# Patient Record
Sex: Female | Born: 1990 | Race: Black or African American | Hispanic: No | Marital: Single | Smoking: Current every day smoker
Health system: Southern US, Community
[De-identification: ages and names within clinical notes are randomized; demographics above are authoritative.]

---

## 2011-10-28 ENCOUNTER — Encounter: Payer: Self-pay | Admitting: *Deleted

## 2011-10-28 ENCOUNTER — Emergency Department (HOSPITAL_BASED_OUTPATIENT_CLINIC_OR_DEPARTMENT_OTHER)
Admission: EM | Admit: 2011-10-28 | Discharge: 2011-10-28 | Disposition: A | Discharge status: Home / Self Care | Payer: Medicaid Other | Attending: Emergency Medicine | Admitting: Emergency Medicine

## 2011-10-28 DIAGNOSIS — IMO0002 Reserved for concepts with insufficient information to code with codable children: Secondary | ICD-10-CM | POA: Insufficient documentation

## 2011-10-28 DIAGNOSIS — T148XXA Other injury of unspecified body region, initial encounter: Secondary | ICD-10-CM

## 2011-10-28 DIAGNOSIS — M25519 Pain in unspecified shoulder: Secondary | ICD-10-CM | POA: Insufficient documentation

## 2011-10-28 DIAGNOSIS — X58XXXA Exposure to other specified factors, initial encounter: Secondary | ICD-10-CM | POA: Insufficient documentation

## 2011-10-28 MED ORDER — IBUPROFEN 600 MG PO TABS: 600.0000 mg | ORAL_TABLET | Freq: Four times a day (QID) | ORAL | Status: AC | PRN

## 2011-10-28 MED ORDER — IBUPROFEN 400 MG PO TABS
600.0000 mg | ORAL_TABLET | Freq: Once | ORAL | Status: AC
  Administered 2011-10-28: 600 mg via ORAL
  Filled 2011-10-28: qty 1

## 2011-10-28 NOTE — ED Notes (Signed)
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

## 2011-10-28 NOTE — ED Provider Notes (Signed)
History     CSN: [id number] Arrival date & time: 10/28/2011 10:22 AM   First MD Initiated Contact with Patient 10/28/11 1029      Chief Complaint  Patient presents with  . Shoulder Pain    (Consider location/radiation/quality/duration/timing/severity/associated sxs/prior treatment) Patient is a 20 y.o. female presenting with shoulder pain. The history is provided by the patient.  Shoulder Pain Pertinent negatives include no chest pain, no abdominal pain, no headaches and no shortness of breath.  pt c/o right shoulder pain in past few days. Dull, contant. Non radiating. Worse w shoulder movements and palp area. No hx ddd. No neck pain. No arm numbness/weakness. No pain in wrist, forearm or elbow. No swelling to extremity. 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 No fever.   History reviewed. No pertinent past medical history.  History reviewed. No pertinent past surgical history.  History reviewed. No pertinent family history.  History  Substance Use Topics  . Smoking status: Never Smoker   . Smokeless tobacco: Never Used  . Alcohol Use: No    OB History    Grav Para Term Preterm Abortions TAB SAB Ect Mult Living                  Review of Systems  Constitutional: Negative for fever.  HENT: Negative for neck pain.   Eyes: Negative for redness.  Respiratory: Negative for shortness of breath.   Cardiovascular: Negative for chest pain.  Gastrointestinal: Negative for abdominal pain.  Genitourinary: Negative for flank pain.  Musculoskeletal: Negative for back pain and joint swelling.  Skin: Negative for rash.  Neurological: Negative for weakness, numbness and headaches.  Hematological: Does not bruise/bleed easily.    Allergies  Review of patient's allergies indicates no known allergies.  Home Medications  No current outpatient  prescriptions on file.  BP 116/53  Pulse 70  Temp(Src) 98.8 F (37.1 C) (Oral)  Resp 18  Ht 5' 7.5" (1.715 m)  Wt 131 lb (59.421 kg)  BMI 20.21 kg/m2  SpO2 100%  LMP 09/29/2011  Physical Exam  Nursing note and vitals reviewed. Constitutional: She appears well-developed and well-nourished. No distress.  Eyes: Conjunctivae are normal. No scleral icterus.  Neck: Neck supple. No tracheal deviation present.       cspine nt, good rom without pain  Cardiovascular: Normal rate, regular rhythm, normal heart sounds and intact distal pulses.  Exam reveals no friction rub.   No murmur heard. Pulmonary/Chest: Effort normal and breath sounds normal. No respiratory distress.  Abdominal: Normal appearance. She exhibits no distension.  Musculoskeletal: Normal range of motion. She exhibits no edema.       Good rom right shoulder, passively without pain. Tenderness right trap muscle. No sts or skin changes noted. Good rom at elbow and wrist without pain. Distal pulses palp. No edema/swelling.   Neurological: She is alert.  Skin: Skin is warm and dry. No rash noted.  Psychiatric: She has a normal mood and affect.    ED Course  Procedures (including critical care time)  Labs Reviewed - No data to display No results found.   No diagnosis found.    MDM  Nursing notes reviewed.  Discussed diff dx incl rotator cuff/muscle strain, tendonitis, referred pain/ddd, etc. Confirmed nkda w pt. Motrin po.         Suzi Roots, MD 10/28/11 [phone]

## 2014-02-23 ENCOUNTER — Emergency Department (HOSPITAL_BASED_OUTPATIENT_CLINIC_OR_DEPARTMENT_OTHER)
Admission: EM | Admit: 2014-02-23 | Discharge: 2014-02-23 | Disposition: A | Discharge status: Home / Self Care | Payer: BC Managed Care – PPO | Attending: Emergency Medicine | Admitting: Emergency Medicine

## 2014-02-23 ENCOUNTER — Encounter (HOSPITAL_BASED_OUTPATIENT_CLINIC_OR_DEPARTMENT_OTHER): Payer: Self-pay | Admitting: Emergency Medicine

## 2014-02-23 DIAGNOSIS — Z3202 Encounter for pregnancy test, result negative: Secondary | ICD-10-CM | POA: Insufficient documentation

## 2014-02-23 DIAGNOSIS — F17200 Nicotine dependence, unspecified, uncomplicated: Secondary | ICD-10-CM | POA: Insufficient documentation

## 2014-02-23 DIAGNOSIS — B349 Viral infection, unspecified: Secondary | ICD-10-CM

## 2014-02-23 DIAGNOSIS — R110 Nausea: Secondary | ICD-10-CM

## 2014-02-23 DIAGNOSIS — B9789 Other viral agents as the cause of diseases classified elsewhere: Secondary | ICD-10-CM | POA: Insufficient documentation

## 2014-02-23 LAB — URINALYSIS, ROUTINE W REFLEX MICROSCOPIC
Bilirubin Urine: NEGATIVE
GLUCOSE, UA: NEGATIVE mg/dL
HGB URINE DIPSTICK: NEGATIVE
Ketones, ur: NEGATIVE mg/dL
LEUKOCYTES UA: NEGATIVE
Nitrite: NEGATIVE
PH: 7 (ref 5.0–8.0)
PROTEIN: NEGATIVE mg/dL
Specific Gravity, Urine: 1.022 (ref 1.005–1.030)
Urobilinogen, UA: 1 mg/dL (ref 0.0–1.0)

## 2014-02-23 LAB — PREGNANCY, URINE: PREG TEST UR: NEGATIVE

## 2014-02-23 MED ORDER — ONDANSETRON 4 MG PO TBDP
4.0000 mg | ORAL_TABLET | Freq: Once | ORAL | Status: AC
  Administered 2014-02-23: 4 mg via ORAL
  Filled 2014-02-23: qty 1

## 2014-02-23 MED ORDER — ONDANSETRON 4 MG PO TBDP: 4.0000 mg | ORAL_TABLET | Freq: Three times a day (TID) | ORAL | Status: DC | PRN

## 2014-02-23 NOTE — Discharge Instructions (Signed)
Nausea, Adult  Nausea means you feel sick to your stomach or need to throw up (vomit). It may be a sign of a more serious problem. If nausea gets worse, you may throw up. If you throw up a lot, you may lose too much body fluid (dehydration).  HOME CARE   · Get plenty of rest.  · Ask your doctor how to replace body fluid losses (rehydrate).  · Eat small amounts of food. Sip liquids more often.  · Take all medicines as told by your doctor.  GET HELP RIGHT AWAY IF:  · You have a fever.  · You pass out (faint).  · You keep throwing up or have blood in your throw up.  · You are very weak, have dry lips or a dry mouth, or you are very thirsty (dehydrated).  · You have dark or bloody poop (stool).  · You have very bad chest or belly (abdominal) pain.  · You do not get better after 2 days, or you get worse.  · You have a headache.  MAKE SURE YOU:  · Understand these instructions.  · Will watch your condition.  · Will get help right away if you are not doing well or get worse.  Document Released: 11/14/2011 Document Revised: 02/17/2012 Document Reviewed: 11/14/2011  ExitCare® Patient Information ©2014 [employer].

## 2014-02-23 NOTE — ED Provider Notes (Addendum)
CSN: [id number]     Arrival date & time 02/23/14  1622 History   First MD Initiated Contact with Patient 02/23/14 1626     Chief Complaint  Patient presents with  . Generalized Body Aches      HPI  Pt reports generalized body aches x 1 week.  Mild associated nausea. Occasional abdominal pain. Mild intermittent headache. No sore throat. No dyspnea shortness of breath. No dysuria or frank hematuria. Denies pregnancy.   History reviewed. No pertinent past medical history. History reviewed. No pertinent past surgical history. History reviewed. No pertinent family history. History  Substance Use Topics  . Smoking status: Current Every Day Smoker -- 0.50 packs/day    Types: Cigarettes  . Smokeless tobacco: Never Used  . Alcohol Use: No   OB History   Grav Para Term Preterm Abortions TAB SAB Ect Mult Living                 Review of Systems  Constitutional: Positive for fatigue. Negative for fever, chills, diaphoresis and appetite change.  HENT: Negative for mouth sores, sore throat and trouble swallowing.   Eyes: Negative for visual disturbance.  Respiratory: Negative for cough, chest tightness, shortness of breath and wheezing.   Cardiovascular: Negative for chest pain.  Gastrointestinal: Positive for nausea and abdominal pain. Negative for vomiting, diarrhea and abdominal distention.  Endocrine: Negative for polydipsia, polyphagia and polyuria.  Genitourinary: Negative for dysuria, frequency and hematuria.  Musculoskeletal: Positive for myalgias. Negative for gait problem.  Skin: Negative for color change, pallor and rash.  Neurological: Positive for headaches. Negative for dizziness, syncope and light-headedness.  Hematological: Does not bruise/bleed easily.  Psychiatric/Behavioral: Negative for behavioral problems and confusion.      Allergies  Review of patient's allergies indicates no known allergies.  Home Medications   Current Outpatient Rx  Name  Route  Sig   Dispense  Refill  . ondansetron (ZOFRAN ODT) 4 MG disintegrating tablet   Oral   Take 1 tablet (4 mg total) by mouth every 8 (eight) hours as needed for nausea.   20 tablet   0    BP 121/73  Pulse 70  Temp(Src) 97.8 F (36.6 C) (Oral)  Resp 18  SpO2 100%  LMP 02/09/2014 Physical Exam  Constitutional: She is oriented to person, place, and time. She appears well-developed and well-nourished. No distress.  HENT:  Head: Normocephalic.  Eyes: Conjunctivae are normal. Pupils are equal, round, and reactive to light. No scleral icterus.  Neck: Normal range of motion. Neck supple. No thyromegaly present.  Cardiovascular: Normal rate and regular rhythm.  Exam reveals no gallop and no friction rub.   No murmur heard. Pulmonary/Chest: Effort normal and breath sounds normal. No respiratory distress. She has no wheezes. She has no rales.  Abdominal: Soft. Bowel sounds are normal. She exhibits no distension. There is no tenderness. There is no rebound.  Musculoskeletal: Normal range of motion.  Neurological: She is alert and oriented to person, place, and time.  Skin: Skin is warm and dry. No rash noted.  Psychiatric: She has a normal mood and affect. Her behavior is normal.    ED Course  Procedures (including critical care time) Labs Review Labs Reviewed  URINALYSIS, ROUTINE W REFLEX MICROSCOPIC - Abnormal; Notable for the following:    APPearance CLOUDY (*)    All other components within normal limits  PREGNANCY, URINE   Imaging Review No results found.   EKG Interpretation None      MDM  Final diagnoses:  Nausea  Viral syndrome    Patient isimproved. Normal urine. Diffuse symptoms. Nausea. Bodyaches for the first day. None since. Occasional cough. No headache or sore throat today. Afebrile. Not hypoxemic. Likely  viral syndrome. Plan will be expectant management. Symptomatic treatment with  Zofran for nausea. Rest and hydrate.    Rolland Porter, MD 02/23/14 1721  Rolland Porter, MD 03/21/14 2350

## 2014-02-23 NOTE — ED Notes (Signed)
Pt reports generalized body aches x 1 week.  Reports decrease in appetite and abdominal pain.

## 2014-06-13 ENCOUNTER — Emergency Department (HOSPITAL_BASED_OUTPATIENT_CLINIC_OR_DEPARTMENT_OTHER): Payer: BC Managed Care – PPO

## 2014-06-13 ENCOUNTER — Emergency Department (HOSPITAL_BASED_OUTPATIENT_CLINIC_OR_DEPARTMENT_OTHER)
Admission: EM | Admit: 2014-06-13 | Discharge: 2014-06-13 | Disposition: A | Discharge status: Home / Self Care | Payer: BC Managed Care – PPO | Attending: Emergency Medicine | Admitting: Emergency Medicine

## 2014-06-13 ENCOUNTER — Encounter (HOSPITAL_BASED_OUTPATIENT_CLINIC_OR_DEPARTMENT_OTHER): Payer: Self-pay | Admitting: Emergency Medicine

## 2014-06-13 DIAGNOSIS — M79609 Pain in unspecified limb: Secondary | ICD-10-CM | POA: Insufficient documentation

## 2014-06-13 DIAGNOSIS — M79671 Pain in right foot: Secondary | ICD-10-CM

## 2014-06-13 DIAGNOSIS — F17200 Nicotine dependence, unspecified, uncomplicated: Secondary | ICD-10-CM | POA: Insufficient documentation

## 2014-06-13 MED ORDER — TRAMADOL HCL 50 MG PO TABS: 50.0000 mg | ORAL_TABLET | Freq: Four times a day (QID) | ORAL | Status: DC | PRN

## 2014-06-13 NOTE — ED Notes (Signed)
Pt had sudden onset of pain in the top of her right foot yesterday. Swollen this am. She can only recall a possible injury when she stepped down abruptly on a curb.

## 2014-06-13 NOTE — Discharge Instructions (Signed)
Ibuprofen 600 mg every 6 hours as needed for pain. Tramadol as needed for pain not relieved with ibuprofen.  Wear Ace bandage as applied. Elevate your foot as often as possible for the next 48 hours.  Return to ER if your symptoms worsen or change and follow up with your primary Dr. if not improving in the next week.   Musculoskeletal Pain Musculoskeletal pain is muscle and boney aches and pains. These pains can occur in any part of the body. Your caregiver may treat you without knowing the cause of the pain. They may treat you if blood or urine tests, X-rays, and other tests were normal.  CAUSES There is often not a definite cause or reason for these pains. These pains may be caused by a type of germ (virus). The discomfort may also come from overuse. Overuse includes working out too hard when your body is not fit. Boney aches also come from weather changes. Bone is sensitive to atmospheric pressure changes. HOME CARE INSTRUCTIONS   Ask when your test results will be ready. Make sure you get your test results.  Only take over-the-counter or prescription medicines for pain, discomfort, or fever as directed by your caregiver. If you were given medications for your condition, do not drive, operate machinery or power tools, or sign legal documents for 24 hours. Do not drink alcohol. Do not take sleeping pills or other medications that may interfere with treatment.  Continue all activities unless the activities cause more pain. When the pain lessens, slowly resume normal activities. Gradually increase the intensity and duration of the activities or exercise.  During periods of severe pain, bed rest may be helpful. Lay or sit in any position that is comfortable.  Putting ice on the injured area.  Put ice in a bag.  Place a towel between your skin and the bag.  Leave the ice on for 15 to 20 minutes, 3 to 4 times a day.  Follow up with your caregiver for continued problems and no reason can be  found for the pain. If the pain becomes worse or does not go away, it may be necessary to repeat tests or do additional testing. Your caregiver may need to look further for a possible cause. SEEK IMMEDIATE MEDICAL CARE IF:  You have pain that is getting worse and is not relieved by medications.  You develop chest pain that is associated with shortness or breath, sweating, feeling sick to your stomach (nauseous), or throw up (vomit).  Your pain becomes localized to the abdomen.  You develop any new symptoms that seem different or that concern you. MAKE SURE YOU:   Understand these instructions.  Will watch your condition.  Will get help right away if you are not doing well or get worse. Document Released: 11/25/2005 Document Revised: 02/17/2012 Document Reviewed: 07/30/2013 [facility] Patient Information 2015 [place], [place]. This information is not intended to replace advice given to you by your health care provider. Make sure you discuss any questions you have with your health care provider.

## 2014-06-13 NOTE — ED Notes (Signed)
Pt. Reports she stepped of the side of a curb yesterday not expecting to step to quick and so hard per Pt.  Pt. Has noted edema in the R foot up to the R mid leg.   Pt. Reports inability to stand on the R foot due to pain.  Pt. Has no noted deformity.

## 2014-06-13 NOTE — ED Provider Notes (Signed)
CSN: [id number]     Arrival date & time 06/13/14  1247 History   First MD Initiated Contact with Patient 06/13/14 1426     Chief Complaint  Patient presents with  . Foot Pain     (Consider location/radiation/quality/duration/timing/severity/associated sxs/prior Treatment) HPI Comments: Patient is a 23 year old female who presents with complaints of pain to the top of her right foot. She states she stepped awkwardly from a curb yesterday but denies any significant injury. She has had worsening pain to the top of her foot since that time. She states she is having difficulty ambulating due to her pain.  Patient is a 23 y.o. female presenting with lower extremity pain. The history is provided by the patient.  Foot Pain This is a new problem. The current episode started yesterday. The problem occurs constantly. The problem has been rapidly worsening. Pertinent negatives include no chest pain and no shortness of breath. Nothing aggravates the symptoms. Nothing relieves the symptoms.    History reviewed. No pertinent past medical history. History reviewed. No pertinent past surgical history. No family history on file. History  Substance Use Topics  . Smoking status: Current Every Day Smoker -- 0.50 packs/day    Types: Cigarettes  . Smokeless tobacco: Never Used  . Alcohol Use: No   OB History   Grav Para Term Preterm Abortions TAB SAB Ect Mult Living                 Review of Systems  Respiratory: Negative for shortness of breath.   Cardiovascular: Negative for chest pain.  All other systems reviewed and are negative.     Allergies  Review of patient's allergies indicates not on file.  Home Medications   Prior to Admission medications   Medication Sig Start Date End Date Taking? Authorizing Provider  ondansetron (ZOFRAN ODT) 4 MG disintegrating tablet Take 1 tablet (4 mg total) by mouth every 8 (eight) hours as needed for nausea. 02/23/14   Rolland PorterMark James, MD   BP 116/72  Pulse  72  Temp(Src) 98 F (36.7 C) (Oral)  Resp 16  Ht 5\' 8"  (1.727 m)  Wt 137 lb (62.143 kg)  BMI 20.84 kg/m2  SpO2 100%  LMP 06/02/2014 Physical Exam  Nursing note and vitals reviewed. Constitutional: She is oriented to person, place, and time. She appears well-developed and well-nourished. No distress.  HENT:  Head: Normocephalic and atraumatic.  Neck: Normal range of motion. Neck supple.  Musculoskeletal:  The right foot appears grossly normal. I am unable to appreciate any significant swelling or edema. There is no erythema or warmth. There is exquisite tenderness to palpation over the dorsal aspect of the foot. She is able to flex and extend her toes and capillary refill is brisk.  There is no calf tenderness or ankle edema.  Neurological: She is alert and oriented to person, place, and time.  Skin: Skin is warm and dry. She is not diaphoretic.    ED Course  Procedures (including critical care time) Labs Review Labs Reviewed - No data to display  Imaging Review Dg Foot Complete Right  06/13/2014   CLINICAL DATA:  Pain and swelling all over RIGHT foot since yesterday, no known injury  EXAM: RIGHT FOOT COMPLETE - 3+ VIEW  COMPARISON:  None  FINDINGS: Osseous mineralization normal.  Joint spaces preserved.  No acute fracture, dislocation or bone destruction.  IMPRESSION: No acute osseous abnormalities.   Electronically Signed   By: Ulyses SouthwardMark  Boles M.D.   On: 06/13/2014  14:09     EKG Interpretation None      MDM   Final diagnoses:  None    X-rays are negative for fracture. This appears to be a sprain of the foot. This will be treated with ibuprofen, tramadol, compression, and when necessary followup. Her history and exam are not consistent with DVT and I do not feel as though further evaluation into this is indicated.    Geoffery Lyonsouglas Lethia Donlon, MD 06/13/14 1435

## 2014-06-26 ENCOUNTER — Encounter (HOSPITAL_BASED_OUTPATIENT_CLINIC_OR_DEPARTMENT_OTHER): Payer: Self-pay | Admitting: Emergency Medicine

## 2014-06-26 ENCOUNTER — Emergency Department (HOSPITAL_BASED_OUTPATIENT_CLINIC_OR_DEPARTMENT_OTHER)
Admission: EM | Admit: 2014-06-26 | Discharge: 2014-06-26 | Disposition: A | Discharge status: Home / Self Care | Payer: BC Managed Care – PPO | Attending: Emergency Medicine | Admitting: Emergency Medicine

## 2014-06-26 DIAGNOSIS — Z3202 Encounter for pregnancy test, result negative: Secondary | ICD-10-CM | POA: Insufficient documentation

## 2014-06-26 DIAGNOSIS — R300 Dysuria: Secondary | ICD-10-CM | POA: Insufficient documentation

## 2014-06-26 DIAGNOSIS — F17200 Nicotine dependence, unspecified, uncomplicated: Secondary | ICD-10-CM | POA: Insufficient documentation

## 2014-06-26 DIAGNOSIS — Z202 Contact with and (suspected) exposure to infections with a predominantly sexual mode of transmission: Secondary | ICD-10-CM | POA: Insufficient documentation

## 2014-06-26 DIAGNOSIS — A499 Bacterial infection, unspecified: Secondary | ICD-10-CM | POA: Insufficient documentation

## 2014-06-26 DIAGNOSIS — N760 Acute vaginitis: Secondary | ICD-10-CM | POA: Insufficient documentation

## 2014-06-26 DIAGNOSIS — R3915 Urgency of urination: Secondary | ICD-10-CM | POA: Insufficient documentation

## 2014-06-26 DIAGNOSIS — B9689 Other specified bacterial agents as the cause of diseases classified elsewhere: Secondary | ICD-10-CM | POA: Insufficient documentation

## 2014-06-26 LAB — URINE MICROSCOPIC-ADD ON

## 2014-06-26 LAB — URINALYSIS, ROUTINE W REFLEX MICROSCOPIC
BILIRUBIN URINE: NEGATIVE
GLUCOSE, UA: NEGATIVE mg/dL
Glucose, UA: NEGATIVE mg/dL
HGB URINE DIPSTICK: NEGATIVE
KETONES UR: 15 mg/dL — AB
Ketones, ur: NEGATIVE mg/dL
NITRITE: NEGATIVE
Nitrite: NEGATIVE
PH: 6 (ref 5.0–8.0)
PH: 6 (ref 5.0–8.0)
PROTEIN: NEGATIVE mg/dL
Protein, ur: NEGATIVE mg/dL
SPECIFIC GRAVITY, URINE: 1.025 (ref 1.005–1.030)
Specific Gravity, Urine: 1.024 (ref 1.005–1.030)
UROBILINOGEN UA: 1 mg/dL (ref 0.0–1.0)
Urobilinogen, UA: 1 mg/dL (ref 0.0–1.0)

## 2014-06-26 LAB — WET PREP, GENITAL
Trich, Wet Prep: NONE SEEN
Yeast Wet Prep HPF POC: NONE SEEN

## 2014-06-26 LAB — RPR

## 2014-06-26 LAB — PREGNANCY, URINE: Preg Test, Ur: NEGATIVE

## 2014-06-26 LAB — HIV ANTIBODY (ROUTINE TESTING W REFLEX): HIV: NONREACTIVE

## 2014-06-26 MED ORDER — METRONIDAZOLE 500 MG PO TABS: 2000.0000 mg | ORAL_TABLET | Freq: Once | ORAL | Status: DC

## 2014-06-26 MED ORDER — METRONIDAZOLE 500 MG PO TABS
2000.0000 mg | ORAL_TABLET | Freq: Once | ORAL | Status: AC
  Administered 2014-06-26: 2000 mg via ORAL
  Filled 2014-06-26: qty 4

## 2014-06-26 NOTE — ED Notes (Signed)
Pelvic cart is at the bedside set up and ready for the doctor to use. 

## 2014-06-26 NOTE — ED Provider Notes (Signed)
CSN: [id number]     Arrival date & time 06/26/14  1231 History   First MD Initiated Contact with Patient 06/26/14 1350     Chief Complaint  Patient presents with  . Exposure to STD     (Consider location/radiation/quality/duration/timing/severity/associated sxs/prior Treatment) HPI Sandra Johns is a 23 y.o. female that presents to the ED for exposure to an STI. Patient's boyfriend was recently treated for Trichomonas. Patient has some vaginal discharge that is clear with a "strong" odor. No fever, nausea or vomiting. Currently on menstrual cycle. Has dysuria and urgency today.  History reviewed. No pertinent past medical history. History reviewed. No pertinent past surgical history. No family history on file. History  Substance Use Topics  . Smoking status: Current Every Day Smoker -- 0.50 packs/day    Types: Cigarettes  . Smokeless tobacco: Never Used  . Alcohol Use: No  Smoking: 1/4PPD 4-5 years.  OB History   Grav Para Term Preterm Abortions TAB SAB Ect Mult Living                  Review of Systems  Constitutional: Negative for fever, chills and diaphoresis.  Genitourinary: Positive for dysuria, urgency and vaginal discharge. Negative for flank pain.  All other systems reviewed and are negative.     Allergies  Review of patient's allergies indicates no known allergies.  Home Medications   Prior to Admission medications   Medication Sig Start Date End Date Taking? Authorizing Provider  ondansetron (ZOFRAN ODT) 4 MG disintegrating tablet Take 1 tablet (4 mg total) by mouth every 8 (eight) hours as needed for nausea. 02/23/14   Rolland Porter, MD  traMADol (ULTRAM) 50 MG tablet Take 1 tablet (50 mg total) by mouth every 6 (six) hours as needed. 06/13/14   Geoffery Lyons, MD   BP 138/90  Pulse 72  Temp(Src) 98.8 F (37.1 C) (Oral)  Resp 16  Ht 5\' 8"  (1.727 m)  Wt 135 lb (61.236 kg)  BMI 20.53 kg/m2  SpO2 100%  LMP 06/02/2014 Physical Exam  Constitutional: She is  oriented to person, place, and time. She appears well-developed.  Abdominal: Soft. Normal appearance and bowel sounds are normal. She exhibits no distension. There is no tenderness. There is no rigidity, no rebound and no guarding.  Genitourinary: Cervix exhibits no motion tenderness. Right adnexum displays no mass. Left adnexum displays no mass.  Bleeding from os. Pool of blood in posterior fornix.  Neurological: She is alert and oriented to person, place, and time.  Skin: Skin is warm and dry.    ED Course  Procedures (including critical care time) Labs Review Labs Reviewed  WET PREP, GENITAL - Abnormal; Notable for the following:    Clue Cells Wet Prep HPF POC FEW (*)    WBC, Wet Prep HPF POC FEW (*)    All other components within normal limits  URINALYSIS, ROUTINE W REFLEX MICROSCOPIC - Abnormal; Notable for the following:    Color, Urine AMBER (*)    Hgb urine dipstick LARGE (*)    Bilirubin Urine SMALL (*)    Leukocytes, UA SMALL (*)    All other components within normal limits  URINE MICROSCOPIC-ADD ON - Abnormal; Notable for the following:    Squamous Epithelial / LPF MANY (*)    Bacteria, UA MANY (*)    All other components within normal limits  URINALYSIS, ROUTINE W REFLEX MICROSCOPIC - Abnormal; Notable for the following:    Ketones, ur 15 (*)    Leukocytes,  UA TRACE (*)    All other components within normal limits  URINE MICROSCOPIC-ADD ON - Abnormal; Notable for the following:    Bacteria, UA MANY (*)    All other components within normal limits  GC/CHLAMYDIA PROBE AMP  URINE CULTURE  PREGNANCY, URINE  HIV ANTIBODY (ROUTINE TESTING)  RPR    Imaging Review No results found.   EKG Interpretation None      MDM   Final diagnoses:  Exposure to sexually transmitted disease (STD)  Bacterial vaginosis   Patient with initial urinalysis contaminated with blood. Repeat UA on in/out catheter specimen. Patient tolerated. Urinalysis significant for bacteria and  trace leukocytes. Wet prep significant for clue cells. Patient given Flagyl 2g for treatment of Trichomonas due to exposure. Will also cover bacterial vaginosis. Will follow-up culture for UTI. Gonorrhea, chlamydia, HIV and RPR in process and will follow-up result. Patient informed of plan. Patient understood and agreed with plan. Stable for discharge home.    Jacquelin Hawkingalph Logan Baltimore, MD 06/26/14 [phone]

## 2014-06-26 NOTE — ED Notes (Signed)
Patient states that she was notified by a previous partner that she may need to be treated for trichomonas.

## 2014-06-26 NOTE — Discharge Instructions (Signed)
You were treated for possible exposure to trichomonas. You were also found to have clue cells which could indicate Bacterial vaginosis. We treated you with metronidazole. Your urinalysis is not suggestive of a urinary infection but we are getting a culture to see if bacteria will grow. We also tested you for gonorrhea, chlamydia, HIV and Syphilis. If any of this is positive, you will be given a call for treatment or follow-up. Please do not have sex for the next 7 days to avoid possible reinfection.

## 2014-06-27 LAB — GC/CHLAMYDIA PROBE AMP
CT PROBE, AMP APTIMA: POSITIVE — AB
GC Probe RNA: NEGATIVE

## 2014-06-27 NOTE — ED Provider Notes (Signed)
I saw and evaluated the patient, reviewed the resident's note and I agree with the findings and plan.   .Face to face Exam:  General:  Awake HEENT:  Atraumatic Resp:  Normal effort Abd:  Nondistended Neuro:No focal weakness   Nelia Shiobert L Madora Barletta, MD 06/27/14 1350

## 2014-06-28 ENCOUNTER — Telehealth (HOSPITAL_BASED_OUTPATIENT_CLINIC_OR_DEPARTMENT_OTHER): Payer: Self-pay | Admitting: Emergency Medicine

## 2014-06-28 NOTE — Telephone Encounter (Signed)
+  Chlamydia. Chart sent to EDP office for review. DHHS attached. 

## 2014-06-29 LAB — URINE CULTURE

## 2015-07-03 ENCOUNTER — Encounter (HOSPITAL_BASED_OUTPATIENT_CLINIC_OR_DEPARTMENT_OTHER): Payer: Self-pay

## 2015-07-03 ENCOUNTER — Emergency Department (HOSPITAL_BASED_OUTPATIENT_CLINIC_OR_DEPARTMENT_OTHER)
Admission: EM | Admit: 2015-07-03 | Discharge: 2015-07-03 | Disposition: A | Discharge status: Home / Self Care | Payer: BC Managed Care – PPO | Attending: Emergency Medicine | Admitting: Emergency Medicine

## 2015-07-03 DIAGNOSIS — Z720 Tobacco use: Secondary | ICD-10-CM | POA: Insufficient documentation

## 2015-07-03 DIAGNOSIS — Z3202 Encounter for pregnancy test, result negative: Secondary | ICD-10-CM | POA: Diagnosis not present

## 2015-07-03 DIAGNOSIS — N3000 Acute cystitis without hematuria: Secondary | ICD-10-CM | POA: Diagnosis not present

## 2015-07-03 DIAGNOSIS — R1030 Lower abdominal pain, unspecified: Secondary | ICD-10-CM | POA: Diagnosis present

## 2015-07-03 LAB — COMPREHENSIVE METABOLIC PANEL
ALBUMIN: 4 g/dL (ref 3.5–5.0)
ALT: 30 U/L (ref 14–54)
AST: 27 U/L (ref 15–41)
Alkaline Phosphatase: 48 U/L (ref 38–126)
Anion gap: 5 (ref 5–15)
BUN: 11 mg/dL (ref 6–20)
CALCIUM: 9.2 mg/dL (ref 8.9–10.3)
CHLORIDE: 108 mmol/L (ref 101–111)
CO2: 26 mmol/L (ref 22–32)
CREATININE: 0.87 mg/dL (ref 0.44–1.00)
GFR calc non Af Amer: >60 mL/min (ref >60)
Glucose, Bld: 87 mg/dL (ref 65–99)
Potassium: 3.6 mmol/L (ref 3.5–5.1)
Sodium: 139 mmol/L (ref 135–145)
Total Bilirubin: 0.5 mg/dL (ref 0.3–1.2)
Total Protein: 7 g/dL (ref 6.5–8.1)

## 2015-07-03 LAB — CBC WITH DIFFERENTIAL/PLATELET
BASOS ABS: 0 10*3/uL (ref 0.0–0.1)
Basophils Relative: 1 % (ref 0–1)
EOS PCT: 1 % (ref 0–5)
Eosinophils Absolute: 0 10*3/uL (ref 0.0–0.7)
HEMATOCRIT: 37.5 % (ref 36.0–46.0)
Hemoglobin: 13.3 g/dL (ref 12.0–15.0)
LYMPHS PCT: 43 % (ref 12–46)
Lymphs Abs: 2.2 10*3/uL (ref 0.7–4.0)
MCH: 31.3 pg (ref 26.0–34.0)
MCHC: 35.5 g/dL (ref 30.0–36.0)
MCV: 88.2 fL (ref 78.0–100.0)
MONO ABS: 0.6 10*3/uL (ref 0.1–1.0)
MONOS PCT: 11 % (ref 3–12)
NEUTROS ABS: 2.3 10*3/uL (ref 1.7–7.7)
Neutrophils Relative %: 44 % (ref 43–77)
Platelets: 222 10*3/uL (ref 150–400)
RBC: 4.25 MIL/uL (ref 3.87–5.11)
RDW: 12.2 % (ref 11.5–15.5)
WBC: 5.1 10*3/uL (ref 4.0–10.5)

## 2015-07-03 LAB — URINALYSIS, ROUTINE W REFLEX MICROSCOPIC
BILIRUBIN URINE: NEGATIVE
Glucose, UA: NEGATIVE mg/dL
Ketones, ur: NEGATIVE mg/dL
NITRITE: NEGATIVE
PROTEIN: NEGATIVE mg/dL
Specific Gravity, Urine: 1.023 (ref 1.005–1.030)
Urobilinogen, UA: 0.2 mg/dL (ref 0.0–1.0)
pH: 6.5 (ref 5.0–8.0)

## 2015-07-03 LAB — URINE MICROSCOPIC-ADD ON

## 2015-07-03 LAB — PREGNANCY, URINE: Preg Test, Ur: NEGATIVE

## 2015-07-03 MED ORDER — NITROFURANTOIN MONOHYD MACRO 100 MG PO CAPS
100.0000 mg | ORAL_CAPSULE | Freq: Once | ORAL | Status: AC
  Administered 2015-07-03: 100 mg via ORAL
  Filled 2015-07-03: qty 1

## 2015-07-03 MED ORDER — PHENAZOPYRIDINE HCL 200 MG PO TABS: 200.0000 mg | ORAL_TABLET | Freq: Three times a day (TID) | ORAL | Status: DC

## 2015-07-03 MED ORDER — PHENAZOPYRIDINE HCL 100 MG PO TABS
200.0000 mg | ORAL_TABLET | Freq: Once | ORAL | Status: AC
  Administered 2015-07-03: 200 mg via ORAL
  Filled 2015-07-03: qty 2

## 2015-07-03 MED ORDER — NITROFURANTOIN MONOHYD MACRO 100 MG PO CAPS: 100.0000 mg | ORAL_CAPSULE | Freq: Two times a day (BID) | ORAL | Status: DC

## 2015-07-03 NOTE — ED Provider Notes (Signed)
CSN: [id number]     Arrival date & time 07/03/15  1127 History   First MD Initiated Contact with Patient 07/03/15 1129     Chief Complaint  Patient presents with  . Flank Pain     (Consider location/radiation/quality/duration/timing/severity/associated sxs/prior Treatment) HPI Comments: Patient with history of chlamydia -- presents with complaint of right flank pain, dysuria, increased frequency and urgency, suprapubic pain for the past 5 days. Symptoms are gradually worsening. Pain does not radiate. She has not had associated fever or vomiting. She has had some nausea. No diarrhea or changes in stools. Patient denies vaginal bleeding or discharge. Last menstrual period was 2 weeks ago and was normal for patient. She is sexually active. No treatments PTA. The onset of this condition was acute. Aggravating factors: palpation. Alleviating factors: none.    Patient is a 24 y.o. female presenting with flank pain. The history is provided by the patient.  Flank Pain Associated symptoms include abdominal pain and nausea. Pertinent negatives include no chest pain, coughing, fever, headaches, myalgias, rash, sore throat or vomiting.    History reviewed. No pertinent past medical history. History reviewed. No pertinent past surgical history. No family history on file. History  Substance Use Topics  . Smoking status: Current Every Day Smoker -- 0.50 packs/day    Types: Cigarettes  . Smokeless tobacco: Never Used  . Alcohol Use: No   OB History    No data available     Review of Systems  Constitutional: Negative for fever.  HENT: Negative for rhinorrhea and sore throat.   Eyes: Negative for redness.  Respiratory: Negative for cough.   Cardiovascular: Negative for chest pain.  Gastrointestinal: Positive for nausea and abdominal pain. Negative for vomiting and diarrhea.  Genitourinary: Positive for dysuria, frequency and flank pain. Negative for hematuria, vaginal bleeding, vaginal discharge  and pelvic pain.  Musculoskeletal: Negative for myalgias.  Skin: Negative for rash.  Neurological: Negative for headaches.    Allergies  Review of patient's allergies indicates no known allergies.  Home Medications   Prior to Admission medications   Not on File   BP 131/81 mmHg  Pulse 58  Temp(Src) 98.6 F (37 C) (Oral)  Resp 16  Ht 5\' 7"  (1.702 m)  Wt 135 lb (61.236 kg)  BMI 21.14 kg/m2  SpO2 100%  LMP 06/19/2015   Physical Exam  Constitutional: She appears well-developed and well-nourished.  HENT:  Head: Normocephalic and atraumatic.  Eyes: Conjunctivae are normal. Right eye exhibits no discharge. Left eye exhibits no discharge.  Neck: Normal range of motion. Neck supple.  Cardiovascular: Normal rate, regular rhythm and normal heart sounds.   No murmur heard. Pulmonary/Chest: Effort normal and breath sounds normal. No respiratory distress.  Abdominal: Soft. Bowel sounds are normal. She exhibits no distension. There is tenderness (no RLQ tenderness) in the suprapubic area. There is no rigidity, no rebound, no guarding, no CVA tenderness (none exhibited), no tenderness at McBurney's point and negative Murphy's sign.  Neurological: She is alert.  Skin: Skin is warm and dry.  Psychiatric: She has a normal mood and affect.  Nursing note and vitals reviewed.   ED Course  Procedures (including critical care time) Labs Review Labs Reviewed  URINALYSIS, ROUTINE W REFLEX MICROSCOPIC (NOT AT [facility]) - Abnormal; Notable for the following:    APPearance CLOUDY (*)    Hgb urine dipstick TRACE (*)    Leukocytes, UA LARGE (*)    All other components within normal limits  URINE MICROSCOPIC-ADD ON -  Abnormal; Notable for the following:    Bacteria, UA MANY (*)    All other components within normal limits  PREGNANCY, URINE  CBC WITH DIFFERENTIAL/PLATELET  COMPREHENSIVE METABOLIC PANEL    Imaging Review No results found.   EKG Interpretation None       11:57 AM Patient  seen and examined. Work-up initiated. Medications ordered.   Vital signs reviewed and are as follows: BP 131/81 mmHg  Pulse 58  Temp(Src) 98.6 F (37 C) (Oral)  Resp 16  Ht  (1.702 m)  Wt 135 lb (61.236 kg)  BMI 21.14 kg/m2  SpO2 100%  LMP 06/19/2015  1:27 PM Patient has obviously infected urine. Will treat with macrobid/pyridium. Symptoms are suggestive of cystitis.   Patient urged to return with worsening symptoms including pain, fever, vomiting or other concerns. Patient verbalized understanding and agrees with plan.    MDM   Final diagnoses:  Acute cystitis without hematuria   Patients with irritative UTI symptoms, too numerous to count white blood cells in urine in a clean catch with any bacteria. Patient has flank pain however no vomiting or fever. I do not suspect pyelonephritis. She does not report any vaginal bleeding or discharge to suggest STD or PID. She appears well, nontoxic. Will treat for UTI. Discussed signs and symptoms that should cause her to return including signs of pyelonephritis.    Renne Crigler, PA-C 07/03/15 1329  Doug Sou, MD 07/03/15 1723

## 2015-07-03 NOTE — Discharge Instructions (Signed)
Please read and follow all provided instructions.  Your diagnoses today include:  1. Acute cystitis without hematuria     Tests performed today include:  Urine test - suggests that you have an infection in your bladder  Vital signs. See below for your results today.   Medications prescribed:   Macrobid - antibiotic for urinary tract infection  You have been prescribed an antibiotic medicine: take the entire course of medicine even if you are feeling better. Stopping early can cause the antibiotic not to work.   Pyridium - medication for urinary tract infection symptoms.   This medication will turn your urine orange. This is normal.   Home care instructions:  Follow any educational materials contained in this packet.  Follow-up instructions: Please follow-up with your primary care provider in 3 days if symptoms are not resolved for further evaluation of your symptoms.  Return instructions:   Please return to the Emergency Department if you experience worsening symptoms.   Return with fever, worsening pain, persistent vomiting, worsening pain in your back.   Please return if you have any other emergent concerns.  Additional Information:  Your vital signs today were: BP 131/81 mmHg   Pulse 58   Temp(Src) 98.6 F (37 C) (Oral)   Resp 16   Ht  (1.702 m)   Wt 135 lb (61.236 kg)   BMI 21.14 kg/m2   SpO2 100%   LMP 06/19/2015 If your blood pressure (BP) was elevated above 135/85 this visit, please have this repeated by your doctor within one month. --------------

## 2015-07-03 NOTE — ED Notes (Signed)
Pt and sig other given snacks and refreshments to go per request.

## 2015-07-03 NOTE — ED Notes (Signed)
Right flank and abd pain x 5 days with urinary freq

## 2015-10-17 ENCOUNTER — Encounter (HOSPITAL_BASED_OUTPATIENT_CLINIC_OR_DEPARTMENT_OTHER): Payer: Self-pay | Admitting: *Deleted

## 2015-10-17 ENCOUNTER — Emergency Department (HOSPITAL_BASED_OUTPATIENT_CLINIC_OR_DEPARTMENT_OTHER)
Admission: EM | Admit: 2015-10-17 | Discharge: 2015-10-17 | Disposition: A | Discharge status: Home / Self Care | Payer: BC Managed Care – PPO | Attending: Emergency Medicine | Admitting: Emergency Medicine

## 2015-10-17 DIAGNOSIS — B9789 Other viral agents as the cause of diseases classified elsewhere: Secondary | ICD-10-CM

## 2015-10-17 DIAGNOSIS — Z79899 Other long term (current) drug therapy: Secondary | ICD-10-CM | POA: Diagnosis not present

## 2015-10-17 DIAGNOSIS — Z720 Tobacco use: Secondary | ICD-10-CM | POA: Insufficient documentation

## 2015-10-17 DIAGNOSIS — J069 Acute upper respiratory infection, unspecified: Secondary | ICD-10-CM | POA: Diagnosis not present

## 2015-10-17 DIAGNOSIS — J029 Acute pharyngitis, unspecified: Secondary | ICD-10-CM | POA: Diagnosis present

## 2015-10-17 LAB — RAPID STREP SCREEN (MED CTR MEBANE ONLY): Streptococcus, Group A Screen (Direct): NEGATIVE

## 2015-10-17 MED ORDER — IBUPROFEN 800 MG PO TABS: 800.0000 mg | ORAL_TABLET | Freq: Three times a day (TID) | ORAL | Status: DC

## 2015-10-17 MED ORDER — IBUPROFEN 800 MG PO TABS
800.0000 mg | ORAL_TABLET | Freq: Once | ORAL | Status: AC
  Administered 2015-10-17: 800 mg via ORAL
  Filled 2015-10-17: qty 1

## 2015-10-17 MED ORDER — ONDANSETRON 8 MG PO TBDP
8.0000 mg | ORAL_TABLET | Freq: Once | ORAL | Status: AC
  Administered 2015-10-17: 8 mg via ORAL
  Filled 2015-10-17: qty 1

## 2015-10-17 MED ORDER — BENZONATATE 100 MG PO CAPS
200.0000 mg | ORAL_CAPSULE | Freq: Once | ORAL | Status: AC
  Administered 2015-10-17: 200 mg via ORAL
  Filled 2015-10-17: qty 2

## 2015-10-17 MED ORDER — FLUTICASONE PROPIONATE 50 MCG/ACT NA SUSP: 2.0000 | Freq: Every day | NASAL | Status: DC

## 2015-10-17 MED ORDER — BENZONATATE 100 MG PO CAPS: 200.0000 mg | ORAL_CAPSULE | Freq: Two times a day (BID) | ORAL | Status: DC | PRN

## 2015-10-17 NOTE — Discharge Instructions (Signed)
1. Medications: flonase, mucinex, tessalon, usual home medications 2. Treatment: rest, drink plenty of fluids, take tylenol or ibuprofen for fever control 3. Follow Up: Please followup with your primary doctor in 3 days for discussion of your diagnoses and further evaluation after today's visit; if you do not have a primary care doctor use the resource guide provided to find one; Return to the ER for high fevers, difficulty breathing or other concerning symptoms    Cough, Adult Coughing is a reflex that clears your throat and your airways. Coughing helps to heal and protect your lungs. It is normal to cough occasionally, but a cough that happens with other symptoms or lasts a long time may be a sign of a condition that needs treatment. A cough may last only 2-3 weeks (acute), or it may last longer than 8 weeks (chronic). CAUSES Coughing is commonly caused by:  Breathing in substances that irritate your lungs.  A viral or bacterial respiratory infection.  Allergies.  Asthma.  Postnasal drip.  Smoking.  Acid backing up from the stomach into the esophagus (gastroesophageal reflux).  Certain medicines.  Chronic lung problems, including COPD (or rarely, lung cancer).  Other medical conditions such as heart failure. HOME CARE INSTRUCTIONS  Pay attention to any changes in your symptoms. Take these actions to help with your discomfort:  Take medicines only as told by your health care provider.  If you were prescribed an antibiotic medicine, take it as told by your health care provider. Do not stop taking the antibiotic even if you start to feel better.  Talk with your health care provider before you take a cough suppressant medicine.  Drink enough fluid to keep your urine clear or pale yellow.  If the air is dry, use a cold steam vaporizer or humidifier in your bedroom or your home to help loosen secretions.  Avoid anything that causes you to cough at work or at home.  If your  cough is worse at night, try sleeping in a semi-upright position.  Avoid cigarette smoke. If you smoke, quit smoking. If you need help quitting, ask your health care provider.  Avoid caffeine.  Avoid alcohol.  Rest as needed. SEEK MEDICAL CARE IF:   You have new symptoms.  You cough up pus.  Your cough does not get better after 2-3 weeks, or your cough gets worse.  You cannot control your cough with suppressant medicines and you are losing sleep.  You develop pain that is getting worse or pain that is not controlled with pain medicines.  You have a fever.  You have unexplained weight loss.  You have night sweats. SEEK IMMEDIATE MEDICAL CARE IF:  You cough up blood.  You have difficulty breathing.  Your heartbeat is very fast.   This information is not intended to replace advice given to you by your health care provider. Make sure you discuss any questions you have with your health care provider.   Document Released: 05/24/2011 Document Revised: 08/16/2015 Document Reviewed: 02/01/2015 Elsevier Interactive Patient Education 2016 [employer]    Emergency Department Resource Guide 1) Find a Doctor and Pay Out of Pocket Although you won't have to find out who is covered by your insurance plan, it is a good idea to ask around and get recommendations. You will then need to call the office and see if the doctor you have chosen will accept you as a new patient and what types of options they offer for patients who are self-pay. Some doctors  offer discounts or will set up payment plans for their patients who do not have insurance, but you will need to ask so you aren't surprised when you get to your appointment. ° °2) Contact Your Local Health Department °Not all health departments have doctors that can see patients for sick visits, but many do, so it is worth a call to see if yours does. If you don't know where your local health department is, you can check in your phone book.  The CDC also has a tool to help you locate your state's health department, and many state websites also have listings of all of their local health departments. ° °3) Find a Walk-in Clinic °If your illness is not likely to be very severe or complicated, you may want to try a walk in clinic. These are popping up all over the country in pharmacies, drugstores, and shopping centers. They're usually staffed by nurse practitioners or physician assistants that have been trained to treat common illnesses and complaints. They're usually fairly quick and inexpensive. However, if you have serious medical issues or chronic medical problems, these are probably not your best option. ° °No Primary Care Doctor: °- Call Health Connect at  832-8000 - they can help you locate a primary care doctor that  accepts your insurance, provides certain services, etc. °- Physician Referral Service- 1-800-533-3463 ° °Chronic Pain Problems: °Organization         Address  Phone   Notes  °[facility]  ([phone]) [phone] Patients need to be referred by their primary care doctor.  ° °Medication Assistance: °Organization         Address  Phone   Notes  °[place] [facility] [address] °[place], [place] [address] [phone]) 641-8030 --Must be a resident of [place] °-- Must have NO insurance coverage whatsoever (no Medicaid/ Medicare, etc.) °-- The pt. MUST have a primary care doctor that directs their care regularly and follows them in the community °  °[facility]ist  (866) 331-1348   °[employer]  (888) 892-1162   ° °Agencies that provide inexpensive medical care: °Organization         Address  Phone   Notes  °[facility]  ([phone]) [phone]   ° Internal Medicine    (336) 832-7272   °[facility] [address] °[place], [place] [address] (336) [phone]   °[facility] [address], °[place] (336) [phone]   °Planned Parenthood     (336) 373-0678   °[facility]    (336) 272-1050   °[facility] ° [address], [place] Phone:  (336) 832-4444, Fax:  (336) 832-4440 Hours of Operation:  9 am - 6 pm, M-F.  Also accepts Medicaid/Medicare and self-pay.  °[facility] ° [address], [place] Phone: (336) [phone], Fax: (336) [phone]. Hours of Operation:  8:30 am - 5:30 pm, M-F.  Also accepts Medicaid and self-pay.  °[facility] [place] [address], [place] Phone: ([phone]) [phone]   °[facility] [address], [place],  (336)723-1848, Ext. 123 Mondays & Thursdays: 7-9 AM.  First 15 patients are seen on a first come, first serve basis. °  ° °Medicaid-accepting [place] Providers: ° °Organization         Address  Phone   Notes  °[facility] [address], [place] ([phone]) [phone] Also accepts self-pay patients.  [facility] [address], [place]  [phone]) [phone]   [facility] [address], [place] [phone](336) [phone]   [facility] [address] [place] [address], [place] [phone]) [phone]   Renaye RakersVeita Bland [address], [place]   [phone] Only accepts [place] Access [place] patients after they have their name applied to their card.   Self-Pay (no insurance) in [facility]:  Organization         Address  Phone   Notes  Sickle Cell Patients, [facility] [address], [place] [phone]) [phone]   [facility] [address], [place] [phone]   [facility] [place]  [address],  [phone]   [facility]/Dr. Osei-Bonsu  [address] [place] [address], [place] or [phone], [place] [phone]) [phone] Phone number for both [place] and [place] locations is the same.  [facility] and  [facility] [address], [place] [phone](336) [phone]   [facility] [place] [address] [place] [address], [place] or [address] [phone]) [phone]   [facility] [address], [place] [phone](336) [phone], phone; [phone](336) [phone], fax Sees patients 1st and 3rd Saturday of every month.  Must not qualify for public or private insurance (i.e. Medicaid, Medicare, [place] Health Choice, Veterans' Benefits)  Household income should be no more than 200% of the poverty level The clinic cannot treat you if you are pregnant or think you are pregnant  Sexually transmitted diseases are not treated at the clinic.    Dental Care: Organization         Address  Phone  Notes  [facility] [place] [facility] [address] [place] [address], [place] [phone]) [phone] Accepts children up to age 25 who are enrolled in [place]icaid or [place] Health Choice; pregnant women with a Medicaid card; and children who have applied for Medicaid or [place] Health Choice, but were declined, whose parents can pay a reduced fee at time of service.  [facility] [place]  [address], [place] [phone](336) [phone] Accepts children up to age 25 who are enrolled in [place] or [place] Health Choice; pregnant women with a Medicaid card; and children who have applied for Medicaid or [place] Health Choice, but were declined, whose parents can pay a reduced fee at time of service.  [facility]  [address], [place] [phone]) [phone] Patients are seen by appointment only. Walk-ins are not accepted. [facility] will see patients 25 years of age and older. Monday - Tuesday (8am-5pm) Most Wednesdays (8:30-5pm) $30 per visit, cash only  [facility]  [address], [facility] [phone]) [phone] Patients are seen by appointment only. Walk-ins are not accepted. [facility] will see patients 25 years of age and  older. One Wednesday Evening (Monthly: Volunteer Based).  $30 per visit, cash only  [facility]  [phone] for adults; Children under age 34, call Graduate Pediatric Dentistry at [phone]. Children aged 54-14, please call [phone] to request a pediatric application.  Dental services are provided in all areas of dental care including fillings, crowns and bridges, complete and partial dentures, implants, gum treatment, root canals, and extractions. Preventive care  is also provided. Treatment is provided to both adults and children. Patients are selected via a lottery and there is often a waiting list.   [facility] [address], [place]  [phone]) [phone] [email or web address]   [facility] [address], [place], [place] [phone], Ext. [phone] Second and Fourth Thursday of each month, opens at 6:30 AM; Clinic ends at 9 AM.  Patients are seen on a first-come first-served basis, and a limited number are seen during each clinic.   [facility]  [address], [place], [place] [phone](336) [phone]   Eligibility Requirements You must have lived in [place], [place], or [place] counties for at least the last three months.   You cannot be eligible for state or federal sponsored [place] Cityhealthcare insurance, including CIGNAVeterans Administration, [place]icaid, or [employer] EntertainmentMedicare.   You generally cannot be eligible for healthcare insurance through your employer.    How to apply: Eligibility screenings are held every Tuesday and Wednesday afternoon from 1:00 pm until 4:00 pm. You do not need an appointment for the interview!  [facility] [address], [place], [place] [phone]   [facility]  [phone]   [facility]  [phone]   [facility]  [phone]    Behavioral Health Resources in the Community: Intensive Outpatient Programs Organization          Address  Phone  Notes  [facility] [address], [place], [place] [phone]   [facility] [address], [place], [place] [phone]   [facility] [address], [place], [place]  [phone]   [facility] [address],  [place], [place] [phone] or [phone]   Substance Abuse Resources Organization         Address  Phone  Notes  [facility]  [phone]   [facility]  [phone]   [facility]  [phone]   [facility]  [phone]   Residential & Outpatient Substance Abuse Program  [phone]   Psychological Services Organization         Address  Phone  Notes  [facility]  [phone]- [phone]   [facility]  [phone]- [phone]   [facility] [address], [place] [phone] or [phone]    Mobile Crisis Teams Organization         Address  Phone  Notes  [facility], Mobile Crisis Care Unit  [phone]   [facility]  [address] [place], [place] [phone]   Doristine LocksSharon DeEsch [address] [place] [phone]    Self-Help/Support Groups Organization         Address  Phone             Notes  [facility] [place] - variety of support groups  336- [phone] Call for more information  Narcotics Anonymous (NA), [facility] [address], [place]  2 meetings at this location   Statisticianesidential Treatment Programs Organization         Address  Phone  Notes  [facility] [address],    [place]  [phone]   [facility]  [address], [place] [phone], [place], [place] [phone]   [facility] [address], [place] [phone] Admissions: 8am-3pm M-F  [facility] [address]  [place], [place]   [facility] [address] Jadene Pierini [place], [place]   [address] [place] [place]  [place], [place]   [facility] - Intensive Outpatient [address], [place], [place]   [facility] ([address]  [place], [place] [phone] or [phone]   [facility] ([facility]) [address] [place], [place] Accepts Medicaid  [facility] [address],  [place] [phone] Substance Abuse/Addiction Treatment   [facility] Organization         Address  Phone  Notes  [facility]  [phone]   Domenic Schwab, PhD [address] Arlis Porta [place], [place]   [phone] or [phone]   [place]oyle [place], [place] [phone]   [facility] [address] [place], [place] [phone] Insurance/Medicaid/sponsorship through [facility] and Families [address] [place]                                    [place], [place] [phone] [address], [place] [phone]    Dr. Adele Schilder  [phone]   [facility]. 1) [address] [place] 2) [place] 3)  [place] [address], [place] [phone]  [phone]   [place] [phone] or [phone] (After Hours)

## 2015-10-17 NOTE — ED Notes (Signed)
Cough and sore throat x 3 days.  

## 2015-10-17 NOTE — ED Provider Notes (Signed)
CSN: [id number]     Arrival date & time 10/17/15  1830 History   First MD Initiated Contact with Patient 10/17/15 2026     Chief Complaint  Patient presents with  . Cough  . Sore Throat     (Consider location/radiation/quality/duration/timing/severity/associated sxs/prior Treatment) Patient is a 24 y.o. female presenting with cough and pharyngitis. The history is provided by the patient and medical records. No language interpreter was used.  Cough Associated symptoms: rhinorrhea and sore throat   Associated symptoms: no chest pain, no chills, no ear pain, no fever, no headaches, no myalgias, no rash, no shortness of breath and no wheezing   Sore Throat Associated symptoms include congestion, coughing, fatigue and a sore throat. Pertinent negatives include no abdominal pain, arthralgias, chest pain, chills, fever, headaches, myalgias, nausea, numbness, rash or vomiting.     Sandra Johns is a 24 y.o. female  with no major medical history presents to the Emergency Department complaining of gradual, persistent, progressively worsening URI symptoms including cough, sore throat, nasal congestion, postnasal drip, otalgia, subjective chills onset 3 days ago. Patient reports no treatments prior to arrival. 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 Nothing seems to make her symptoms better or worse. She denies fever, headache, neck pain, neck stiffness, chest pain, shortness of breath abdominal pain, nausea, vomiting, diarrhea weakness, dizziness, syncope.  History reviewed. No pertinent past medical history. History reviewed. No pertinent past surgical history. No family history on file. Social History  Substance Use Topics  . Smoking status: Current Every Day Smoker -- 0.50 packs/day    Types: Cigarettes  . Smokeless tobacco: Never Used  . Alcohol Use: No   OB History    No data available     Review of Systems  Constitutional: Positive for  fatigue. Negative for fever, chills and appetite change.  HENT: Positive for congestion, postnasal drip, rhinorrhea, sinus pressure and sore throat. Negative for ear discharge, ear pain and mouth sores.   Eyes: Negative for visual disturbance.  Respiratory: Positive for cough. Negative for chest tightness, shortness of breath, wheezing and stridor.   Cardiovascular: Negative for chest pain, palpitations and leg swelling.  Gastrointestinal: Negative for nausea, vomiting, abdominal pain and diarrhea.  Genitourinary: Negative for dysuria, urgency, frequency and hematuria.  Musculoskeletal: Negative for myalgias, back pain, arthralgias and neck stiffness.  Skin: Negative for rash.  Neurological: Negative for syncope, light-headedness, numbness and headaches.  Hematological: Negative for adenopathy.  Psychiatric/Behavioral: The patient is not nervous/anxious.   All other systems reviewed and are negative.     Allergies  Review of patient's allergies indicates no known allergies.  Home Medications   Prior to Admission medications   Medication Sig Start Date End Date Taking? Authorizing Provider  benzonatate (TESSALON) 100 MG capsule Take 2 capsules (200 mg total) by mouth 2 (two) times daily as needed for cough. 10/17/15   Carrye Goller, PA-C  fluticasone (FLONASE) 50 MCG/ACT nasal spray Place 2 sprays into both nostrils daily. 10/17/15   Gaius Ishaq, PA-C  ibuprofen (ADVIL,MOTRIN) 800 MG tablet Take 1 tablet (800 mg total) by mouth 3 (three) times daily. 10/17/15   Orian Figueira, PA-C  nitrofurantoin, macrocrystal-monohydrate, (MACROBID) 100 MG capsule Take 1 capsule (100 mg total) by mouth 2 (two) times daily. 07/03/15   Renne Crigler, PA-C  phenazopyridine (PYRIDIUM) 200 MG tablet Take 1 tablet (200 mg total) by mouth 3 (three) times daily. 07/03/15   Renne Crigler, PA-C   BP  119/86 mmHg  Pulse 90  Temp(Src) 99.4 F (37.4 C) (Oral)  Resp 20  Ht 5\' 9"  (1.753 m)  Wt 134  lb 5 oz (60.924 kg)  BMI 19.83 kg/m2  SpO2 100%  LMP 09/26/2015 Physical Exam  Constitutional: She is oriented to person, place, and time. She appears well-developed and well-nourished. No distress.  HENT:  Head: Normocephalic and atraumatic.  Right Ear: Tympanic membrane, external ear and ear canal normal.  Left Ear: Tympanic membrane, external ear and ear canal normal.  Nose: Mucosal edema and rhinorrhea present. No epistaxis. Right sinus exhibits no maxillary sinus tenderness and no frontal sinus tenderness. Left sinus exhibits no maxillary sinus tenderness and no frontal sinus tenderness.  Mouth/Throat: Uvula is midline, oropharynx is clear and moist and mucous membranes are normal. Mucous membranes are not pale and not cyanotic. No oropharyngeal exudate, posterior oropharyngeal edema, posterior oropharyngeal erythema or tonsillar abscesses.  Eyes: Conjunctivae are normal. Pupils are equal, round, and reactive to light.  Neck: Normal range of motion and full passive range of motion without pain.  Cardiovascular: Normal rate, normal heart sounds and intact distal pulses.   No murmur heard. Pulmonary/Chest: Effort normal and breath sounds normal. No stridor.  Clear and equal breath sounds without focal wheezes, rhonchi, rales  Abdominal: Soft. Bowel sounds are normal. There is no tenderness.  Musculoskeletal: Normal range of motion.  Lymphadenopathy:    She has no cervical adenopathy.  Neurological: She is alert and oriented to person, place, and time.  Skin: Skin is warm and dry. No rash noted. She is not diaphoretic.  Psychiatric: She has a normal mood and affect.  Nursing note and vitals reviewed.   ED Course  Procedures (including critical care time) Labs Review Labs Reviewed  RAPID STREP SCREEN (NOT [facility])  CULTURE, GROUP A STREP    Imaging Review No results found. I have personally reviewed and evaluated these images and lab results as part of my medical  decision-making.   EKG Interpretation None      MDM   Final diagnoses:  Viral URI with cough    Sandra Johns presents with URI symptoms.  Pt afebrile without focal lung sounds. Doubt pneumonia. No indication for chest x-ray at this time. Patients symptoms are consistent with URI, likely viral etiology. Discussed that antibiotics are not indicated for viral infections. Pt will be discharged with symptomatic treatment.  Verbalizes understanding and is agreeable with plan. Pt is hemodynamically stable & in NAD prior to dc.  BP 119/86 mmHg  Pulse 90  Temp(Src) 99.4 F (37.4 C) (Oral)  Resp 20  Ht 5\' 9"  (1.753 m)  Wt 134 lb 5 oz (60.924 kg)  BMI 19.83 kg/m2  SpO2 100%  LMP 09/26/2015     Dahlia ClientHannah Treyvon Blahut, PA-C 10/17/15 2113  Leta BaptistEmily Roe Nguyen, MD 10/18/15 [phone]

## 2015-10-21 LAB — CULTURE, GROUP A STREP

## 2015-11-23 ENCOUNTER — Ambulatory Visit (HOSPITAL_COMMUNITY): Payer: BC Managed Care – PPO | Admitting: Psychiatry

## 2015-11-30 ENCOUNTER — Emergency Department (HOSPITAL_BASED_OUTPATIENT_CLINIC_OR_DEPARTMENT_OTHER)
Admission: EM | Admit: 2015-11-30 | Discharge: 2015-11-30 | Disposition: A | Discharge status: Home / Self Care | Payer: BC Managed Care – PPO | Attending: Emergency Medicine | Admitting: Emergency Medicine

## 2015-11-30 ENCOUNTER — Encounter (HOSPITAL_BASED_OUTPATIENT_CLINIC_OR_DEPARTMENT_OTHER): Payer: Self-pay | Admitting: *Deleted

## 2015-11-30 DIAGNOSIS — F17210 Nicotine dependence, cigarettes, uncomplicated: Secondary | ICD-10-CM | POA: Diagnosis not present

## 2015-11-30 DIAGNOSIS — N760 Acute vaginitis: Secondary | ICD-10-CM | POA: Diagnosis not present

## 2015-11-30 DIAGNOSIS — R300 Dysuria: Secondary | ICD-10-CM | POA: Diagnosis present

## 2015-11-30 DIAGNOSIS — Z3202 Encounter for pregnancy test, result negative: Secondary | ICD-10-CM | POA: Diagnosis not present

## 2015-11-30 DIAGNOSIS — B9689 Other specified bacterial agents as the cause of diseases classified elsewhere: Secondary | ICD-10-CM

## 2015-11-30 LAB — URINE MICROSCOPIC-ADD ON

## 2015-11-30 LAB — URINALYSIS, ROUTINE W REFLEX MICROSCOPIC
BILIRUBIN URINE: NEGATIVE
Glucose, UA: NEGATIVE mg/dL
Ketones, ur: 15 mg/dL — AB
Nitrite: NEGATIVE
PROTEIN: 100 mg/dL — AB
Specific Gravity, Urine: 1.027 (ref 1.005–1.030)
pH: 8 (ref 5.0–8.0)

## 2015-11-30 LAB — WET PREP, GENITAL
Sperm: NONE SEEN
Trich, Wet Prep: NONE SEEN
Yeast Wet Prep HPF POC: NONE SEEN

## 2015-11-30 LAB — PREGNANCY, URINE: PREG TEST UR: NEGATIVE

## 2015-11-30 MED ORDER — AZITHROMYCIN 250 MG PO TABS
1000.0000 mg | ORAL_TABLET | Freq: Once | ORAL | Status: AC
Start: 2015-11-30 — End: 2015-11-30
  Administered 2015-11-30: 1000 mg via ORAL
  Filled 2015-11-30 (×2): qty 4

## 2015-11-30 MED ORDER — METRONIDAZOLE 500 MG PO TABS: 500.0000 mg | ORAL_TABLET | Freq: Two times a day (BID) | ORAL | Status: DC

## 2015-11-30 MED ORDER — CEFTRIAXONE SODIUM 250 MG IJ SOLR
250.0000 mg | Freq: Once | INTRAMUSCULAR | Status: AC
  Administered 2015-11-30: 250 mg via INTRAMUSCULAR
  Filled 2015-11-30: qty 250

## 2015-11-30 MED ORDER — HYDROCODONE-ACETAMINOPHEN 5-325 MG PO TABS
1.0000 | ORAL_TABLET | Freq: Once | ORAL | Status: AC
  Administered 2015-11-30: 1 via ORAL
  Filled 2015-11-30 (×2): qty 1

## 2015-11-30 MED ORDER — ONDANSETRON 4 MG PO TBDP: 4.0000 mg | ORAL_TABLET | Freq: Once | ORAL | Status: DC

## 2015-11-30 MED ORDER — HYDROCORTISONE 1 % EX CREA: TOPICAL_CREAM | CUTANEOUS | Status: DC

## 2015-11-30 NOTE — ED Notes (Signed)
Pt c/o freq  painful urination x 2 days  

## 2015-11-30 NOTE — ED Notes (Signed)
MD at bedside. 

## 2015-11-30 NOTE — ED Notes (Signed)
Patient states she is just ready to go home that she has been here since 3 and she is hurting. RN andrea made aware.

## 2015-11-30 NOTE — ED Provider Notes (Signed)
CSN: [id number]     Arrival date & time 11/30/15  1556 History   First MD Initiated Contact with Patient 11/30/15 1720     Chief Complaint  Patient presents with  . Dysuria     (Consider location/radiation/quality/duration/timing/severity/associated sxs/prior Treatment) HPI   Patient presents to the emergency department with complaints of painful urination for 2 days as well as copious amounts of clear, yellow vaginal discharge with pain and burning. She has not had any abdominal pain, vaginal bleeding, nausea, vomiting, diarrhea, fevers. She is sexually active but reports that her partner has been the same for one year and that these protection. She is also on the Depakote Provera shot  History reviewed. No pertinent past medical history. History reviewed. No pertinent past surgical history. History reviewed. No pertinent family history. Social History  Substance Use Topics  . Smoking status: Current Every Day Smoker -- 0.50 packs/day    Types: Cigarettes  . Smokeless tobacco: Never Used  . Alcohol Use: No   OB History    No data available     Review of Systems  Review of Systems All other systems negative except as documented in the HPI. All pertinent positives and negatives as reviewed in the HPI.   Allergies  Review of patient's allergies indicates no known allergies.  Home Medications   Prior to Admission medications   Medication Sig Start Date End Date Taking? Authorizing Provider  hydrocortisone cream 1 % Apply to affected area 2 times daily as needed 11/30/15   Marlon Pel, PA-C  metroNIDAZOLE (FLAGYL) 500 MG tablet Take 1 tablet (500 mg total) by mouth 2 (two) times daily. 11/30/15   Amamda Curbow Neva Seat, PA-C   BP 118/75 mmHg  Pulse 102  Temp(Src) 99.1 F (37.3 C) (Oral)  Resp 18  Ht  (1.702 m)  Wt 58.968 kg  BMI 20.36 kg/m2  SpO2 100%  LMP 11/04/2015 Physical Exam  Constitutional: She appears well-developed and well-nourished. No distress.  HENT:   Head: Normocephalic and atraumatic.  Right Ear: Tympanic membrane and ear canal normal.  Left Ear: Tympanic membrane and ear canal normal.  Nose: Nose normal.  Mouth/Throat: Uvula is midline, oropharynx is clear and moist and mucous membranes are normal.  Eyes: Pupils are equal, round, and reactive to light.  Neck: Normal range of motion. Neck supple.  Cardiovascular: Normal rate and regular rhythm.   Pulmonary/Chest: Effort normal.  Abdominal: Soft.  No signs of abdominal distention  Genitourinary: Uterus normal. Cervix exhibits no motion tenderness, no discharge and no friability. Right adnexum displays no mass and no tenderness. Left adnexum displays no mass and no tenderness. No erythema, tenderness or bleeding in the vagina. No foreign body around the vagina. No signs of injury around the vagina. Vaginal discharge (clear, straw colored discharge) found.  Musculoskeletal:  No LE swelling  Neurological: She is alert.  Acting at baseline  Skin: Skin is warm and dry. No rash noted.  Nursing note and vitals reviewed.   ED Course  Procedures (including critical care time) Labs Review Labs Reviewed  WET PREP, GENITAL - Abnormal; Notable for the following:    Clue Cells Wet Prep HPF POC PRESENT (*)    WBC, Wet Prep HPF POC MANY (*)    All other components within normal limits  URINALYSIS, ROUTINE W REFLEX MICROSCOPIC (NOT AT [facility]) - Abnormal; Notable for the following:    APPearance TURBID (*)    Hgb urine dipstick TRACE (*)    Ketones, ur 15 (*)  Protein, ur 100 (*)    Leukocytes, UA LARGE (*)    All other components within normal limits  URINE MICROSCOPIC-ADD ON - Abnormal; Notable for the following:    Squamous Epithelial / LPF 0-5 (*)    Bacteria, UA MANY (*)    Crystals TRIPLE PHOSPHATE CRYSTALS (*)    All other components within normal limits  URINE CULTURE  PREGNANCY, URINE  GC/CHLAMYDIA PROBE AMP ([place]) NOT AT [facility]    Imaging Review No results found. I  have personally reviewed and evaluated these images and lab results as part of my medical decision-making.   EKG Interpretation None      MDM   Final diagnoses:  Bacterial vaginosis   Abdomen is soft. Wet prep shows clue cells and MANY WBC.  UA most likely contaminated and sent out for culture.  Medications  cefTRIAXone (ROCEPHIN) injection 250 mg (250 mg Intramuscular Given 11/30/15 1931)  azithromycin (ZITHROMAX) tablet 1,000 mg (1,000 mg Oral Given 11/30/15 1932)  HYDROcodone-acetaminophen (NORCO/VICODIN) 5-325 MG per tablet 1 tablet (1 tablet Oral Given 11/30/15 1933)    Rx: Metronidazole PO [facility] refrain from sexual activity until antibiotics completed. Discussed s/sx that warrant return to the ED.  Filed Vitals:   11/30/15 1609 11/30/15 1916  BP: 122/87 118/75  Pulse: 94 102  Temp: 99.1 F (37.3 C)   Resp: 18 [address]18      Amarra Sawyer, PA-C 12/05/15 [id number]  Rolland PorterMark James, MD 12/16/15 [phone]

## 2015-11-30 NOTE — Discharge Instructions (Signed)
Bacterial Vaginosis °Bacterial vaginosis is a vaginal infection that occurs when the normal balance of bacteria in the vagina is disrupted. It results from an overgrowth of certain bacteria. This is the most common vaginal infection in women of childbearing age. Treatment is important to prevent complications, especially in pregnant women, as it can cause a premature delivery. °CAUSES  °Bacterial vaginosis is caused by an increase in harmful bacteria that are normally present in smaller amounts in the vagina. Several different kinds of bacteria can cause bacterial vaginosis. However, the reason that the condition develops is not fully understood. °RISK FACTORS °Certain activities or behaviors can put you at an increased risk of developing bacterial vaginosis, including: °· Having a new sex partner or multiple sex partners. °· Douching. °· Using an intrauterine device (IUD) for contraception. °Women do not get bacterial vaginosis from toilet seats, bedding, swimming pools, or contact with objects around them. °SIGNS AND SYMPTOMS  °Some women with bacterial vaginosis have no signs or symptoms. Common symptoms include: °· Grey vaginal discharge. °· A fishlike odor with discharge, especially after sexual intercourse. °· Itching or burning of the vagina and vulva. °· Burning or pain with urination. °DIAGNOSIS  °Your health care provider will take a medical history and examine the vagina for signs of bacterial vaginosis. A sample of vaginal fluid may be taken. Your health care provider will look at this sample under a microscope to check for bacteria and abnormal cells. A vaginal pH test may also be done.  °TREATMENT  °Bacterial vaginosis may be treated with antibiotic medicines. These may be given in the form of a pill or a vaginal cream. A second round of antibiotics may be prescribed if the condition comes back after treatment. Because bacterial vaginosis increases your risk for sexually transmitted diseases, getting  treated can help reduce your risk for chlamydia, gonorrhea, HIV, and herpes. °HOME CARE INSTRUCTIONS  °· Only take over-the-counter or prescription medicines as directed by your health care provider. °· If antibiotic medicine was prescribed, take it as directed. Make sure you finish it even if you start to feel better. °· Tell all sexual partners that you have a vaginal infection. They should see their health care provider and be treated if they have problems, such as a mild rash or itching. °· During treatment, it is important that you follow these instructions: °¨ Avoid sexual activity or use condoms correctly. °¨ Do not douche. °¨ Avoid alcohol as directed by your health care provider. °¨ Avoid breastfeeding as directed by your health care provider. °SEEK MEDICAL CARE IF:  °· Your symptoms are not improving after 3 days of treatment. °· You have increased discharge or pain. °· You have a fever. °MAKE SURE YOU:  °· Understand these instructions. °· Will watch your condition. °· Will get help right away if you are not doing well or get worse. °FOR MORE INFORMATION  °Centers for Disease Control and Prevention, Division of STD Prevention: www.cdc.gov/std °American Sexual Health Association (ASHA): www.ashastd.org  °  °This information is not intended to replace advice given to you by your health care provider. Make sure you discuss any questions you have with your health care provider. °  °Document Released: 11/25/2005 Document Revised: 12/16/2014 Document Reviewed: 07/07/2013 °Elsevier Interactive Patient Education ©2016 Elsevier Inc[employer] ° ° ° °Safe Sex °Safe sex is about reducing the risk of giving or getting a sexually transmitted disease (STD). STDs are spread through sexual contact involving the genitals, mouth, or rectum. Some STDs can be   cured and others cannot. Safe sex can also prevent unintended pregnancies.  °WHAT ARE SOME SAFE SEX PRACTICES? °· Limit your sexual activity to only one partner who is having sex  with only you. °· Talk to your partner about his or her past partners, past STDs, and drug use. °· Use a condom every time you have sexual intercourse. This includes vaginal, oral, and anal sexual activity. Both females and males should wear condoms during oral sex. Only use latex or polyurethane condoms and water-based lubricants. Using petroleum-based lubricants or oils to lubricate a condom will weaken the condom and increase the chance that it will break. The condom should be in place from the beginning to the end of sexual activity. Wearing a condom reduces, but does not completely eliminate, your risk of getting or giving an STD. STDs can be spread by contact with infected body fluids and skin. °· Get vaccinated for hepatitis B and HPV. °· Avoid alcohol and recreational drugs, which can affect your judgment. You may forget to use a condom or participate in high-risk sex. °· For females, avoid douching after sexual intercourse. Douching can spread an infection farther into the reproductive tract. °· Check your body for signs of sores, blisters, rashes, or unusual discharge. See your health care provider if you notice any of these signs. °· Avoid sexual contact if you have symptoms of an infection or are being treated for an STD. If you or your partner has herpes, avoid sexual contact when blisters are present. Use condoms at all other times. °· If you are at risk of being infected with HIV, it is recommended that you take a prescription medicine daily to prevent HIV infection. This is called pre-exposure prophylaxis (PrEP). You are considered at risk if: °¨ You are a man who has sex with other men (MSM). °¨ You are a heterosexual man or woman who is sexually active with more than one partner. °¨ You take drugs by injection. °¨ You are sexually active with a partner who has HIV. °· Talk with your health care provider about whether you are at high risk of being infected with HIV. If you choose to begin PrEP, you  should first be tested for HIV. You should then be tested every 3 months for as long as you are taking PrEP. °· See your health care provider for regular screenings, exams, and tests for other STDs. Before having sex with a new partner, each of you should be screened for STDs and should talk about the results with each other. °WHAT ARE THE BENEFITS OF SAFE SEX?  °· There is less chance of getting or giving an STD. °· You can prevent unwanted or unintended pregnancies. °· By discussing safe sex concerns with your partner, you may increase feelings of intimacy, comfort, trust, and honesty between the two of you. °  °This information is not intended to replace advice given to you by your health care provider. Make sure you discuss any questions you have with your health care provider. °  °Document Released: 01/02/2005 Document Revised: 12/16/2014 Document Reviewed: 05/18/2012 °Elsevier Interactive Patient Education ©2016 Elsevier Inc. ° °

## 2015-12-03 LAB — URINE CULTURE
Culture: >=100000
Special Requests: NORMAL

## 2015-12-05 ENCOUNTER — Telehealth (HOSPITAL_COMMUNITY): Payer: Self-pay

## 2015-12-05 NOTE — Telephone Encounter (Signed)
12/04/2015 Post ED Visit - Positive Culture Follow-up: Successful Patient Follow-Up  Culture assessed and recommendations reviewed by: []  Enzo BiNathan Batchelder, Pharm.D. []  Celedonio MiyamotoJeremy Frens, Pharm.D., BCPS []  Garvin FilaMike Maccia, Pharm.D. []  Georgina PillionElizabeth Martin, Pharm.D., BCPS []  McBaineMinh Pham, [address]Pharm[address]D., BCPS, AAHIVP []  Estella HuskMichelle Turner, Pharm.D., BCPS, AAHIVP []  Tennis Mustassie Stewart, Pharm.D. []  Sherle Poeob Vincent, [address]Ph[address]  Positive urine culture, >/= 100,000 colonies -> Proteus Mirabilis   [x]  Patient discharged without antimicrobial prescription and treatment is now indicated []  Organism is resistant to prescribed ED discharge antimicrobial []  Patient with positive blood cultures  Changes discussed with ED provider[place] New antibiotic prescription Bactrim DS po BID x 3 days Called to [facility])  Contacted patient, date 12/04/2015, time 11:46     [name], Sandra Rallis Dorn 12/05/2015, 4:06 PM

## 2015-12-08 LAB — GC/CHLAMYDIA PROBE AMP (~~LOC~~) NOT AT ARMC
Chlamydia: NEGATIVE
Neisseria Gonorrhea: NEGATIVE

## 2016-01-15 ENCOUNTER — Emergency Department (HOSPITAL_BASED_OUTPATIENT_CLINIC_OR_DEPARTMENT_OTHER): Payer: Medicaid Other

## 2016-01-15 ENCOUNTER — Emergency Department (HOSPITAL_BASED_OUTPATIENT_CLINIC_OR_DEPARTMENT_OTHER)
Admission: EM | Admit: 2016-01-15 | Discharge: 2016-01-15 | Disposition: A | Discharge status: Home / Self Care | Payer: Medicaid Other | Attending: Emergency Medicine | Admitting: Emergency Medicine

## 2016-01-15 ENCOUNTER — Encounter (HOSPITAL_BASED_OUTPATIENT_CLINIC_OR_DEPARTMENT_OTHER): Payer: Self-pay | Admitting: Emergency Medicine

## 2016-01-15 DIAGNOSIS — R0789 Other chest pain: Secondary | ICD-10-CM | POA: Diagnosis not present

## 2016-01-15 DIAGNOSIS — N939 Abnormal uterine and vaginal bleeding, unspecified: Secondary | ICD-10-CM

## 2016-01-15 DIAGNOSIS — Z3A00 Weeks of gestation of pregnancy not specified: Secondary | ICD-10-CM | POA: Diagnosis not present

## 2016-01-15 DIAGNOSIS — F17210 Nicotine dependence, cigarettes, uncomplicated: Secondary | ICD-10-CM | POA: Insufficient documentation

## 2016-01-15 DIAGNOSIS — R05 Cough: Secondary | ICD-10-CM | POA: Diagnosis not present

## 2016-01-15 DIAGNOSIS — Z793 Long term (current) use of hormonal contraceptives: Secondary | ICD-10-CM | POA: Diagnosis not present

## 2016-01-15 DIAGNOSIS — O200 Threatened abortion: Secondary | ICD-10-CM

## 2016-01-15 DIAGNOSIS — O9989 Other specified diseases and conditions complicating pregnancy, childbirth and the puerperium: Secondary | ICD-10-CM | POA: Insufficient documentation

## 2016-01-15 DIAGNOSIS — O99331 Smoking (tobacco) complicating pregnancy, first trimester: Secondary | ICD-10-CM | POA: Diagnosis not present

## 2016-01-15 LAB — URINE MICROSCOPIC-ADD ON

## 2016-01-15 LAB — I-STAT CHEM 8, ED
BUN: 13 mg/dL (ref 6–20)
Calcium, Ion: 1.3 mmol/L — ABNORMAL HIGH (ref 1.12–1.23)
Chloride: 105 mmol/L (ref 101–111)
Creatinine, Ser: 0.7 mg/dL (ref 0.44–1.00)
GLUCOSE: 88 mg/dL (ref 65–99)
HCT: 33 % — ABNORMAL LOW (ref 36.0–46.0)
HEMOGLOBIN: 11.2 g/dL — AB (ref 12.0–15.0)
Potassium: 3.8 mmol/L (ref 3.5–5.1)
SODIUM: 141 mmol/L (ref 135–145)
TCO2: 24 mmol/L (ref 0–100)

## 2016-01-15 LAB — URINALYSIS, ROUTINE W REFLEX MICROSCOPIC
BILIRUBIN URINE: NEGATIVE
Glucose, UA: NEGATIVE mg/dL
Ketones, ur: NEGATIVE mg/dL
NITRITE: NEGATIVE
PROTEIN: NEGATIVE mg/dL
SPECIFIC GRAVITY, URINE: 1.026 (ref 1.005–1.030)
pH: 6.5 (ref 5.0–8.0)

## 2016-01-15 LAB — D-DIMER, QUANTITATIVE (NOT AT ARMC): D DIMER QUANT: 0.27 ug{FEU}/mL (ref 0.00–0.50)

## 2016-01-15 LAB — ABO/RH: ABO/RH(D): A POS

## 2016-01-15 LAB — WET PREP, GENITAL
SPERM: NONE SEEN
TRICH WET PREP: NONE SEEN
Yeast Wet Prep HPF POC: NONE SEEN

## 2016-01-15 LAB — PREGNANCY, URINE: PREG TEST UR: POSITIVE — AB

## 2016-01-15 LAB — TROPONIN I

## 2016-01-15 LAB — HCG, QUANTITATIVE, PREGNANCY: hCG, Beta Chain, Quant, S: 1691 m[IU]/mL — ABNORMAL HIGH (ref <5)

## 2016-01-15 MED ORDER — CEPHALEXIN 500 MG PO CAPS: 500.0000 mg | ORAL_CAPSULE | Freq: Four times a day (QID) | ORAL | Status: AC

## 2016-01-15 NOTE — ED Provider Notes (Signed)
CSN: [id number]     Arrival date & time 01/15/16  0802 History   First MD Initiated Contact with Patient 01/15/16 [phone]     Chief Complaint  Patient presents with  . Chest Pain      Patient is a 25 y.o. female presenting with chest pain. The history is provided by the patient. No language interpreter was used.  Chest Pain Sandra Johns is a 25 y.o. female who presents to the Emergency Department complaining of chest pain.  She reports 1 day of right-sided chest pain. Pain was sudden in onset and is described as dull and constant. It is located in the right anterior and axillary chest wall. It is worse with deep breathing and activity. She has associated cough that has been ongoing for the last week. No hemoptysis. Any fevers, abdominal pain, nausea, vomiting, dysuria, leg swelling or pain. She has no medical problems. She takes oral contraceptives and is a smoker. No similar symptoms in the past. Symptoms are moderate, constant, worsening.   History reviewed. No pertinent past medical history. History reviewed. No pertinent past surgical history. No family history on file. Social History  Substance Use Topics  . Smoking status: Current Every Day Smoker -- 0.50 packs/day    Types: Cigarettes  . Smokeless tobacco: Never Used  . Alcohol Use: No   OB History    No data available     Review of Systems  Cardiovascular: Positive for chest pain.  All other systems reviewed and are negative.     Allergies  Review of patient's allergies indicates no known allergies.  Home Medications   Prior to Admission medications   Medication Sig Start Date End Date Taking? Authorizing Provider  cephALEXin (KEFLEX) 500 MG capsule Take 1 capsule (500 mg total) by mouth 4 (four) times daily. 01/15/16   Tilden Fossa, MD   BP 114/78 mmHg  Pulse 60  Temp(Src) 98.4 F (36.9 C) (Oral)  Resp 16  Ht  (1.727 m)  Wt 135 lb (61.236 kg)  BMI 20.53 kg/m2  SpO2 100%  LMP 01/07/2016 Physical Exam   Constitutional: She is oriented to person, place, and time. She appears well-developed and well-nourished.  HENT:  Head: Normocephalic and atraumatic.  Cardiovascular: Normal rate and regular rhythm.   No murmur heard. Pulmonary/Chest: Effort normal and breath sounds normal. No respiratory distress. She exhibits no tenderness.  Abdominal: Soft. There is no tenderness. There is no rebound and no guarding.  Genitourinary:  Moderate amount of vaginal bleeding, os closed, no CMT or adnexal tenderness  Musculoskeletal: She exhibits no edema or tenderness.  Neurological: She is alert and oriented to person, place, and time.  Skin: Skin is warm and dry.  Psychiatric: She has a normal mood and affect. Her behavior is normal.  Nursing note and vitals reviewed.   ED Course  Procedures (including critical care time) Labs Review Labs Reviewed  WET PREP, GENITAL - Abnormal; Notable for the following:    Clue Cells Wet Prep HPF POC PRESENT (*)    WBC, Wet Prep HPF POC FEW (*)    All other components within normal limits  URINALYSIS, ROUTINE W REFLEX MICROSCOPIC (NOT AT [facility]) - Abnormal; Notable for the following:    APPearance CLOUDY (*)    Hgb urine dipstick LARGE (*)    Leukocytes, UA MODERATE (*)    All other components within normal limits  PREGNANCY, URINE - Abnormal; Notable for the following:    Preg Test, Ur POSITIVE (*)  All other components within normal limits  URINE MICROSCOPIC-ADD ON - Abnormal; Notable for the following:    Squamous Epithelial / LPF 0-5 (*)    Bacteria, UA MANY (*)    All other components within normal limits  HCG, QUANTITATIVE, PREGNANCY - Abnormal; Notable for the following:    hCG, Beta Chain, Quant, S 1691 (*)    All other components within normal limits  I-STAT CHEM 8, ED - Abnormal; Notable for the following:    Calcium, Ion 1.30 (*)    Hemoglobin 11.2 (*)    HCT 33.0 (*)    All other components within normal limits  TROPONIN I  D-DIMER,  QUANTITATIVE (NOT AT [facility])  ABO/RH  GC/CHLAMYDIA PROBE AMP ([place]) NOT AT [facility]    Imaging Review US Ob Comp Less 14 Wks  01/15/2016  CLINICAL DATA:  Vaginal bleeding for 10 days with cramping. Positive beta HCG value. EXAM: OBSTETRIC <14 WK [place] AND TRANSVAGINAL OB US TECHNIQUE: Both transabdominal and transvaginal ultrasound examinations were performed for complete evaluation of the gestation as well as the maternal uterus, adnexal regions, and pelvic cul-de-sac. Transvaginal technique was performed to assess early pregnancy. COMPARISON:  None. FINDINGS: Intrauterine gestational sac: Not visualized Yolk sac:  Not visualized Embryo:  Not visualized Cardiac Activity: Not visualized Maternal uterus/adnexae: Uterus measures 6.1 x 4.1 x 5.3 cm in size. No intrauterine mass is seen. The endometrium measures 13 mm in thickness and appears somewhat inhomogeneous. Right ovary measures 2.5 x 2.3 x 2.6 cm. Left ovary could not be visualized by either transabdominal or transvaginal technique. No left-sided pelvic mass is seen. There is a small amount of free pelvic fluid. IMPRESSION: No intrauterine mass or gestation is seen. Endometrium is mildly inhomogeneous. There is a small amount of free pelvic fluid. Left ovary not visualize ; no extrauterine pelvic or adnexal masses appreciable by either transabdominal or transvaginal technique on this study. Given these findings and a positive beta HCG value, differential considerations include intrauterine gestation too early to be seen by either transabdominal or transvaginal technique; recent spontaneous abortion; possible ectopic gestation. Close clinical and laboratory correlation advised. In particular, serial beta HCG values advised. Timing of repeat ultrasound study will in large part depend on clinical findings and serial beta HCG values. Electronically Signed   By: Bretta Bang III M.D.   On: 01/15/2016 10:55   US Ob Transvaginal  01/15/2016  CLINICAL DATA:   Vaginal bleeding for 10 days with cramping. Positive beta HCG value. EXAM: OBSTETRIC <14 WK [place] AND TRANSVAGINAL OB US TECHNIQUE: Both transabdominal and transvaginal ultrasound examinations were performed for complete evaluation of the gestation as well as the maternal uterus, adnexal regions, and pelvic cul-de-sac. Transvaginal technique was performed to assess early pregnancy. COMPARISON:  None. FINDINGS: Intrauterine gestational sac: Not visualized Yolk sac:  Not visualized Embryo:  Not visualized Cardiac Activity: Not visualized Maternal uterus/adnexae: Uterus measures 6.1 x 4.1 x 5.3 cm in size. No intrauterine mass is seen. The endometrium measures 13 mm in thickness and appears somewhat inhomogeneous. Right ovary measures 2.5 x 2.3 x 2.6 cm. Left ovary could not be visualized by either transabdominal or transvaginal technique. No left-sided pelvic mass is seen. There is a small amount of free pelvic fluid. IMPRESSION: No intrauterine mass or gestation is seen. Endometrium is mildly inhomogeneous. There is a small amount of free pelvic fluid. Left ovary not visualize ; no extrauterine pelvic or adnexal masses appreciable by either transabdominal or transvaginal technique on this study.  Given these findings and a positive beta HCG value, differential considerations include intrauterine gestation too early to be seen by either transabdominal or transvaginal technique; recent spontaneous abortion; possible ectopic gestation. Close clinical and laboratory correlation advised. In particular, serial beta HCG values advised. Timing of repeat ultrasound study will in large part depend on clinical findings and serial beta HCG values. Electronically Signed   By: Bretta Bang III M.D.   On: 01/15/2016 10:55   I have personally reviewed and evaluated these images and lab results as part of my medical decision-making.   EKG Interpretation   Date/Time:  Monday January 15 2016 08:50:56 EST Ventricular Rate:   64 PR Interval:  156 QRS Duration: 81 QT Interval:  386 QTC Calculation: 398 R Axis:   58 Text Interpretation:  Sinus rhythm Probable left atrial enlargement RSR'  in V1 or V2, probably normal variant ST elev, probable normal early repol  pattern Confirmed by Lincoln Brigham [phone]) on 01/15/2016 9:03:37 AM      MDM   Final diagnoses:  Chest wall pain  Threatened abortion    Patient here for evaluation of right-sided chest pain. She has no abdominal tenderness on examination. Lungs are clear on examination. Presentation is not consistent with PE, ACS, dissection, pneumonia, referred biliary pain. Suddenly found to be pregnant emergency department. On further questioning she reports 6 days of heavy menses, increase from her baseline. She is using about 6-7 pads daily. LMP was about a month ago. Discussed with patient threatened miscarriage and importance of OB/GYN follow-up for repeat HCG as well as close return precautions for any progressive bleeding or new concerning symptoms. The chest pain recommend Tylenol for chest wall pain.    Tilden Fossa, MD 01/15/16 [phone]

## 2016-01-15 NOTE — ED Notes (Signed)
Onset of right yesterday

## 2016-01-15 NOTE — Discharge Instructions (Signed)
Chest Wall Pain Chest wall pain is pain in or around the bones and muscles of your chest. Sometimes, an injury causes this pain. Sometimes, the cause may not be known. This pain may take several weeks or longer to get better. HOME CARE INSTRUCTIONS  Pay attention to any changes in your symptoms. Take these actions to help with your pain:   Rest as told by your health care provider.   Avoid activities that cause pain. These include any activities that use your chest muscles or your abdominal and side muscles to lift heavy items.   If directed, apply ice to the painful area:  Put ice in a plastic bag.  Place a towel between your skin and the bag.  Leave the ice on for 20 minutes, 2-3 times per day.  Take over-the-counter and prescription medicines only as told by your health care provider.  Do not use tobacco products, including cigarettes, chewing tobacco, and e-cigarettes. If you need help quitting, ask your health care provider.  Keep all follow-up visits as told by your health care provider. This is important. SEEK MEDICAL CARE IF:  You have a fever.  Your chest pain becomes worse.  You have new symptoms. SEEK IMMEDIATE MEDICAL CARE IF:  You have nausea or vomiting.  You feel sweaty or light-headed.  You have a cough with phlegm (sputum) or you cough up blood.  You develop shortness of breath.   This information is not intended to replace advice given to you by your health care provider. Make sure you discuss any questions you have with your health care provider.   Document Released: 11/25/2005 Document Revised: 08/16/2015 Document Reviewed: 02/20/2015 Elsevier Interactive Patient Education 2016 [employer].  Threatened Miscarriage A threatened miscarriage occurs when you have vaginal bleeding during your first 20 weeks of pregnancy but the pregnancy has not ended. If you have vaginal bleeding during this time, your health care provider will do tests to make sure  you are still pregnant. If the tests show you are still pregnant and the developing baby (fetus) inside your womb (uterus) is still growing, your condition is considered a threatened miscarriage. A threatened miscarriage does not mean your pregnancy will end, but it does increase the risk of losing your pregnancy (complete miscarriage). CAUSES  The cause of a threatened miscarriage is usually not known. If you go on to have a complete miscarriage, the most common cause is an abnormal number of chromosomes in the developing baby. Chromosomes are the structures inside cells that hold all your genetic material. Some causes of vaginal bleeding that do not result in miscarriage include:  Having sex.  Having an infection.  Normal hormone changes of pregnancy.  Bleeding that occurs when an egg implants in your uterus. RISK FACTORS Risk factors for bleeding in early pregnancy include:  Obesity.  Smoking.  Drinking excessive amounts of alcohol or caffeine.  Recreational drug use. SIGNS AND SYMPTOMS  Light vaginal bleeding.  Mild abdominal pain or cramps. DIAGNOSIS  If you have bleeding with or without abdominal pain before 20 weeks of pregnancy, your health care provider will do tests to check whether you are still pregnant. One important test involves using sound waves and a computer (ultrasound) to create images of the inside of your uterus. Other tests include an internal exam of your vagina and uterus (pelvic exam) and measurement of your baby's heart rate.  You may be diagnosed with a threatened miscarriage if:  Ultrasound testing shows you are still pregnant.  Your baby's heart rate is strong.  A pelvic exam shows that the opening between your uterus and your vagina (cervix) is closed.  Your heart rate and blood pressure are stable.  Blood tests confirm you are still pregnant. TREATMENT  No treatments have been shown to prevent a threatened miscarriage from going on to a  complete miscarriage. However, the right home care is important.  HOME CARE INSTRUCTIONS   Make sure you keep all your appointments for prenatal care. This is very important.  Get plenty of rest.  Do not have sex or use tampons if you have vaginal bleeding.  Do not douche.  Do not smoke or use recreational drugs.  Do not drink alcohol.  Avoid caffeine. SEEK MEDICAL CARE IF:  You have light vaginal bleeding or spotting while pregnant.  You have abdominal pain or cramping.  You have a fever. SEEK IMMEDIATE MEDICAL CARE IF:  You have heavy vaginal bleeding.  You have blood clots coming from your vagina.  You have severe low back pain or abdominal cramps.  You have fever, chills, and severe abdominal pain. MAKE SURE YOU:  Understand these instructions.  Will watch your condition.  Will get help right away if you are not doing well or get worse.   This information is not intended to replace advice given to you by your health care provider. Make sure you discuss any questions you have with your health care provider.   Document Released: 11/25/2005 Document Revised: 11/30/2013 Document Reviewed: 09/21/2013 Elsevier Interactive Patient Education [employer]

## 2016-01-16 LAB — GC/CHLAMYDIA PROBE AMP (~~LOC~~) NOT AT ARMC
CHLAMYDIA, DNA PROBE: NEGATIVE
NEISSERIA GONORRHEA: NEGATIVE

## 2016-01-17 ENCOUNTER — Other Ambulatory Visit: Payer: Self-pay | Admitting: Certified Nurse Midwife

## 2016-01-17 ENCOUNTER — Other Ambulatory Visit: Payer: BC Managed Care – PPO

## 2016-01-17 ENCOUNTER — Ambulatory Visit (INDEPENDENT_AMBULATORY_CARE_PROVIDER_SITE_OTHER): Payer: Self-pay | Admitting: Certified Nurse Midwife

## 2016-01-17 DIAGNOSIS — O0281 Inappropriate change in quantitative human chorionic gonadotropin (hCG) in early pregnancy: Secondary | ICD-10-CM

## 2016-01-17 DIAGNOSIS — O3680X0 Pregnancy with inconclusive fetal viability, not applicable or unspecified: Secondary | ICD-10-CM

## 2016-01-17 LAB — HCG, QUANTITATIVE, PREGNANCY: HCG, BETA CHAIN, QUANT, S: 2165 m[IU]/mL — AB (ref <5)

## 2016-01-17 NOTE — Patient Instructions (Signed)
Ruptured Ectopic Pregnancy °An ectopic pregnancy is when the fertilized egg attaches (implants) outside the uterus. Most ectopic pregnancies occur in the fallopian tube. Rarely do ectopic pregnancies occur on the ovary, intestine, pelvis, or cervix. An ectopic pregnancy does not have the ability to develop into a normal, healthy baby.  °A ruptured ectopic pregnancy is one in which the fallopian tube gets torn or bursts and results in internal bleeding. Often there is intense abdominal pain, and sometimes, vaginal bleeding. Having an ectopic pregnancy can be a life-threatening experience. If left untreated, this dangerous condition can lead to a blood transfusion, abdominal surgery, or even death.  °CAUSES  °Damage to the fallopian tubes is the suspected cause in most ectopic pregnancies.  °RISK FACTORS °Depending on your circumstances, the amount of risk of having an ectopic pregnancy will vary. There are 3 categories that may help you identify whether you are potentially at risk. °High Risk °· You have gone through infertility treatment. °· You have had a previous ectopic pregnancy. °· You have had previous tubal surgery. °· You have had previous surgery to have the fallopian tubes tied (tubal ligation). °· You have tubal problems or diseases. °· You have been exposed to DES. DES is a medicine that was used until 1971 and had effects on babies whose mothers took the medicine. °· You become pregnant while using an intrauterine device (IUD) for birth control.  °Moderate Risk °· You have a history of infertility. °· You have a history of a sexually transmitted infection (STI). °· You have a history of pelvic inflammatory disease (PID). °· You have scarring from endometriosis. °· You have multiple sexual partners. °· You smoke.  °Low Risk °· You have had previous pelvic surgery. °· You use vaginal douching. °· You became sexually active before 26 years of age. °SYMPTOMS °An ectopic pregnancy should be suspected in  anyone who has missed a period and has abdominal pain or bleeding. °· You may experience normal pregnancy symptoms, such as: °¨ Nausea. °¨ Tiredness. °¨ Breast tenderness. °· Symptoms that are not normal include: °¨ Pain with intercourse. °¨ Irregular vaginal bleeding or spotting. °¨ Cramping or pain on one side, or in the lower abdomen. °¨ Fast heartbeat. °¨ Passing out while having a bowel movement. °· Symptoms of a ruptured ectopic pregnancy and internal bleeding may include: °¨ Sudden, severe pain in the abdomen and pelvis. °¨ Dizziness or fainting. °¨ Pain in the shoulder area. °DIAGNOSIS  °Tests that may be performed include: °· A pregnancy test. °· An ultrasound. °· Testing the specific level of pregnancy hormone in the bloodstream. °· Taking a sample of uterus tissue (dilation and curettage, D&C). °· Surgery to perform a visual exam of the inside of the abdomen using a lighted tube (laparoscopy). °TREATMENT  °Laparoscopic surgery or abdominal surgery is recommended for a ruptured ectopic pregnancy.  °· The whole fallopian tube may need to be removed (salpingectomy). °· If the tube is not too damaged, the tube may be saved, and the pregnancy will be surgically removed. In time, the tube may still function. °· If you have lost a lot of blood, you may need a blood transfusion. °· You may receive a Rho (D) immune globulin shot if you are Rh negative and the father is Rh positive, or if you do not know the Rh type of the father. This is to prevent problems with any future pregnancy. °SEEK IMMEDIATE MEDICAL CARE IF:  °You have any symptoms of an ectopic or ruptured ectopic pregnancy. This   is a medical emergency. °MAKE SURE YOU: °· Understand these instructions. °· Will watch your condition. °· Will get help right away if you are not doing well or get worse. °  °This information is not intended to replace advice given to you by your health care provider. Make sure you discuss any questions you have with your health  care provider. °  °Document Released: 11/22/2000 Document Revised: 11/30/2013 Document Reviewed: 09/06/2013 °Elsevier Interactive Patient Education ©2016 Elsevier Inc. ° °

## 2016-01-18 ENCOUNTER — Encounter: Payer: Self-pay | Admitting: Obstetrics and Gynecology

## 2016-01-19 ENCOUNTER — Other Ambulatory Visit: Payer: Self-pay

## 2016-01-19 DIAGNOSIS — O200 Threatened abortion: Secondary | ICD-10-CM

## 2016-01-20 LAB — HCG, QUANTITATIVE, PREGNANCY: HCG, BETA CHAIN, QUANT, S: 2584.7 m[IU]/mL — AB

## 2016-01-22 ENCOUNTER — Telehealth: Payer: Self-pay

## 2016-01-22 NOTE — Telephone Encounter (Signed)
Patient called was giving quant results per Dr. Alvester Morin, patient was told to return for another quant level her levels did not increase enough. Patient understood and is booked to come back in on 01/25/16 @ 8:15am

## 2016-01-25 ENCOUNTER — Other Ambulatory Visit: Payer: Self-pay

## 2017-06-02 IMAGING — US US OB COMP LESS 14 WK
1 series · 13 of 28 positions shown · non-contrast
Comparison: None.

CLINICAL DATA: Vaginal bleeding for 10 days with cramping. Positive
beta HCG value.

EXAM:
OBSTETRIC <14 WK US AND TRANSVAGINAL OB US
TECHNIQUE: Both transabdominal and transvaginal ultrasound examinations were
performed for complete evaluation of the gestation as well as the
maternal uterus, adnexal regions, and pelvic cul-de-sac.
Transvaginal technique was performed to assess early pregnancy.

[Series 1: us ob comp less 14 wk · 0.17mm/px · 13 of 55 slices shown]
[im 3/55]
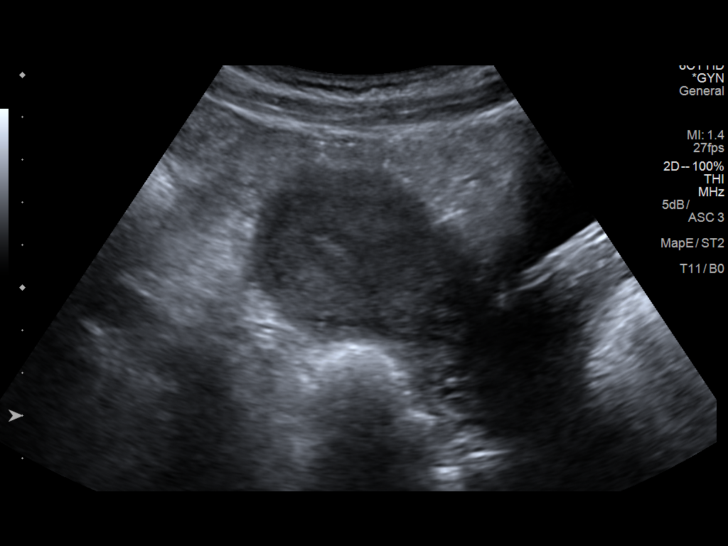
[im 7/55]
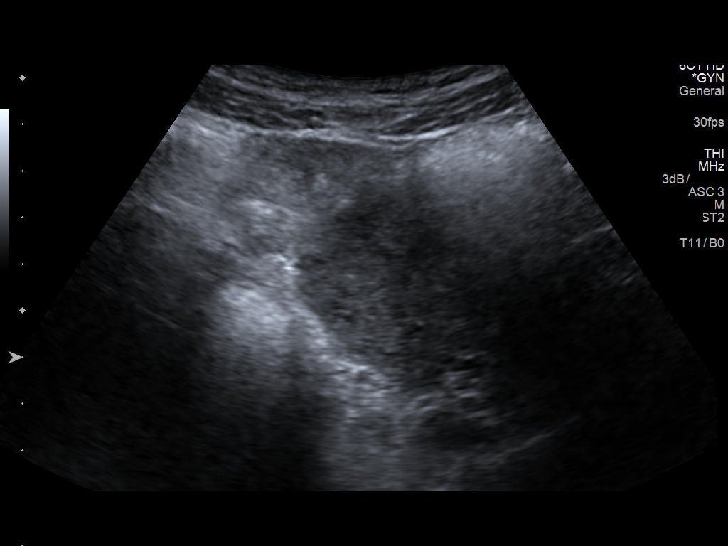
[im 11/55]
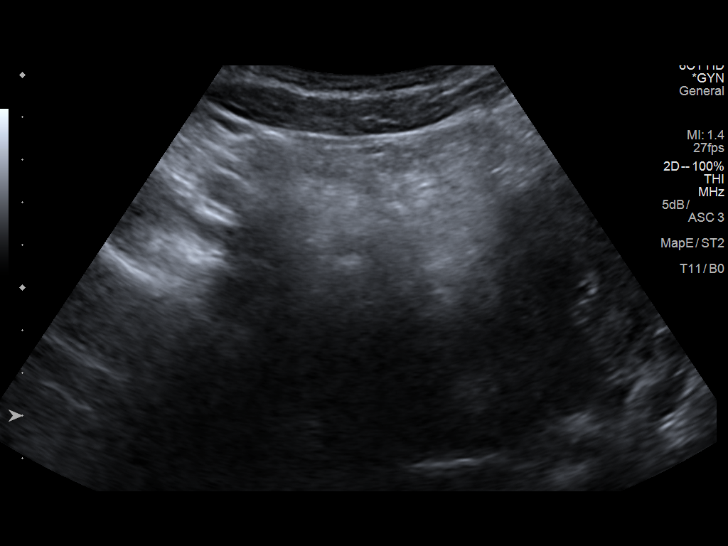
[im 15/55]
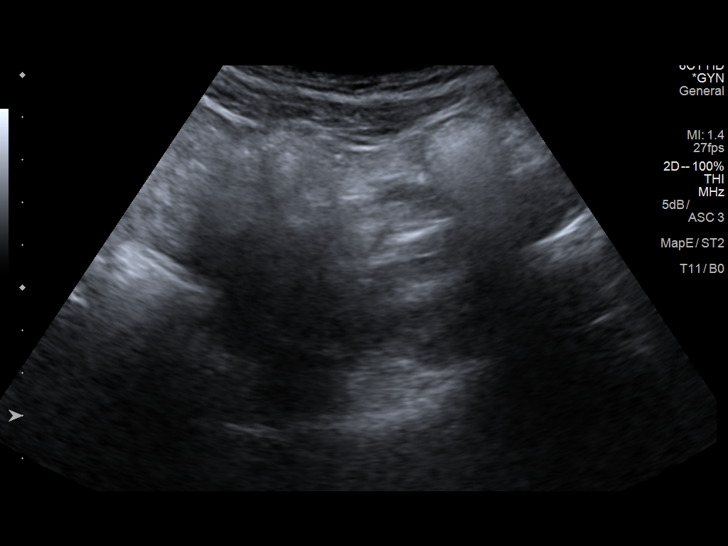
[im 19/55]
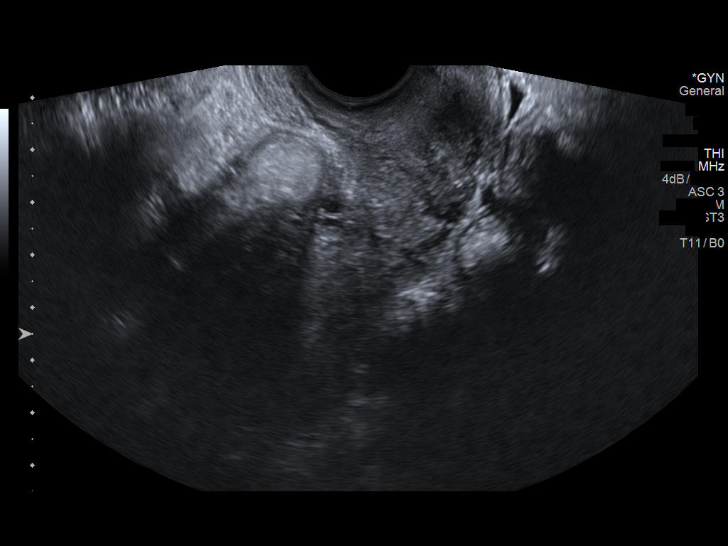
[im 23/55]
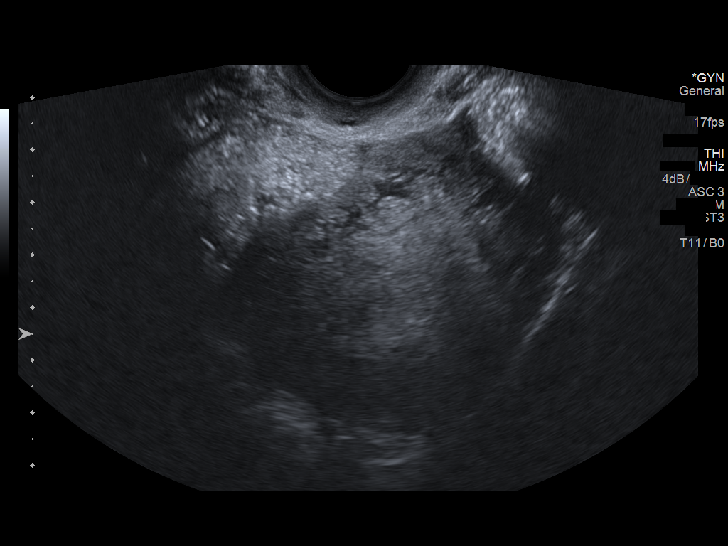
[im 29/55]
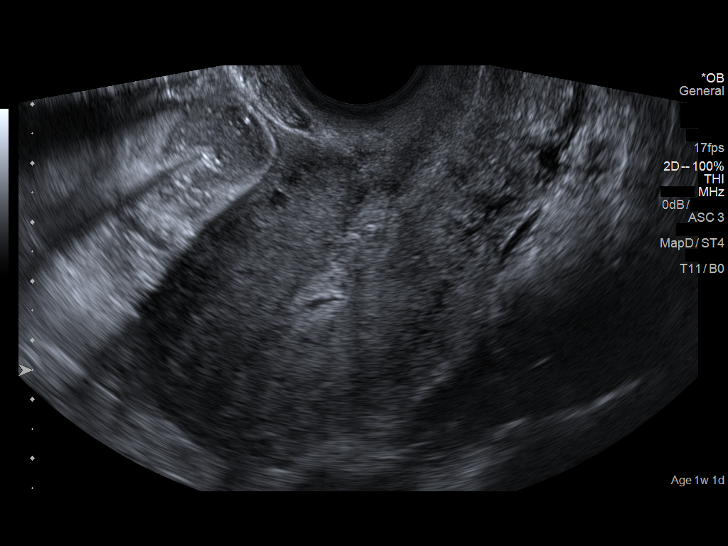
[im 33/55]
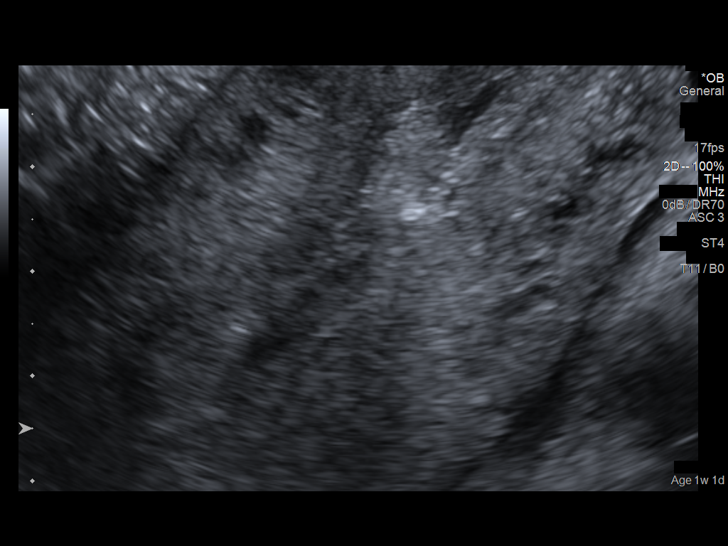
[im 37/55]
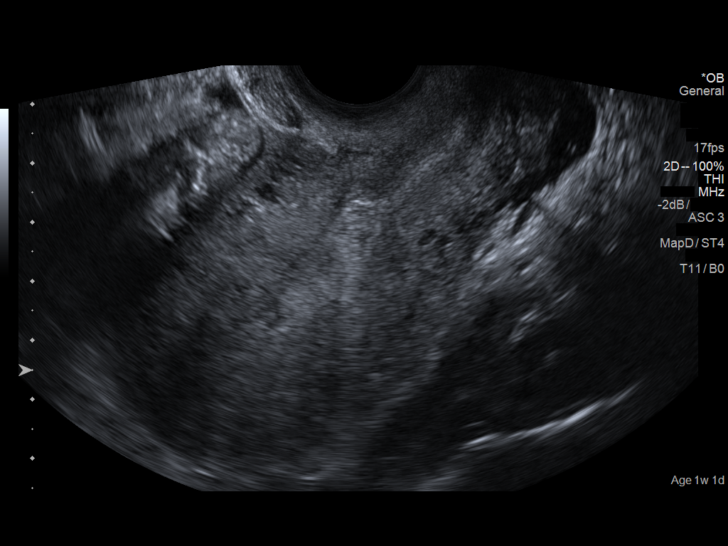
[im 41/55]
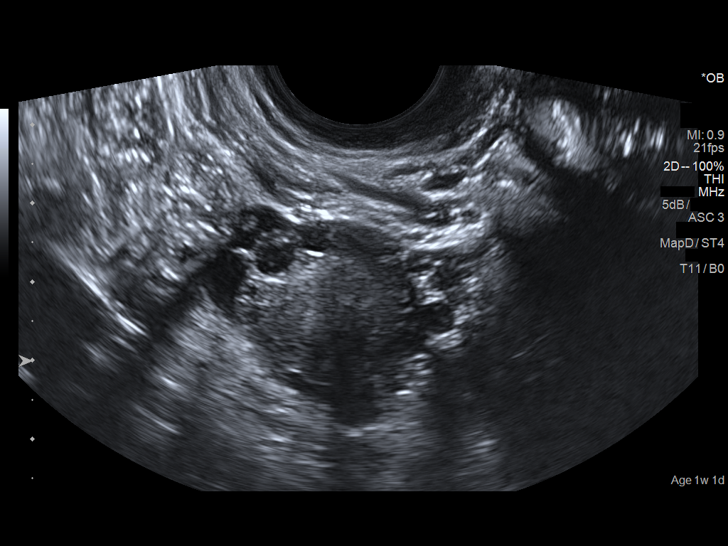
[im 45/55]
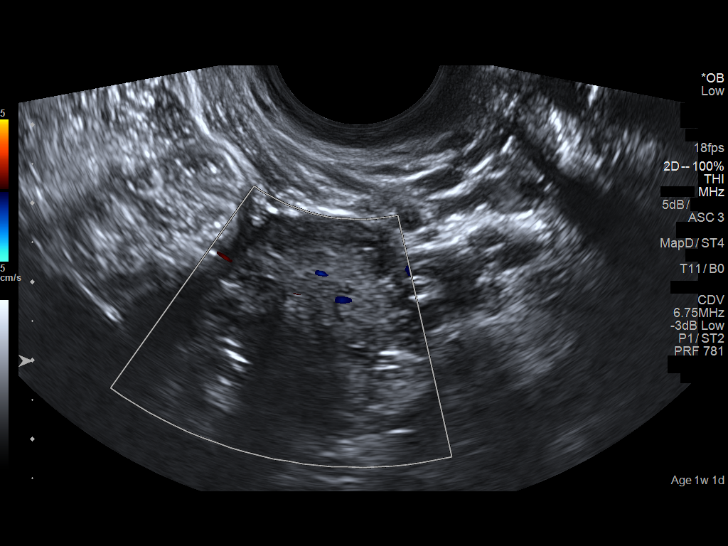
[im 49/55]
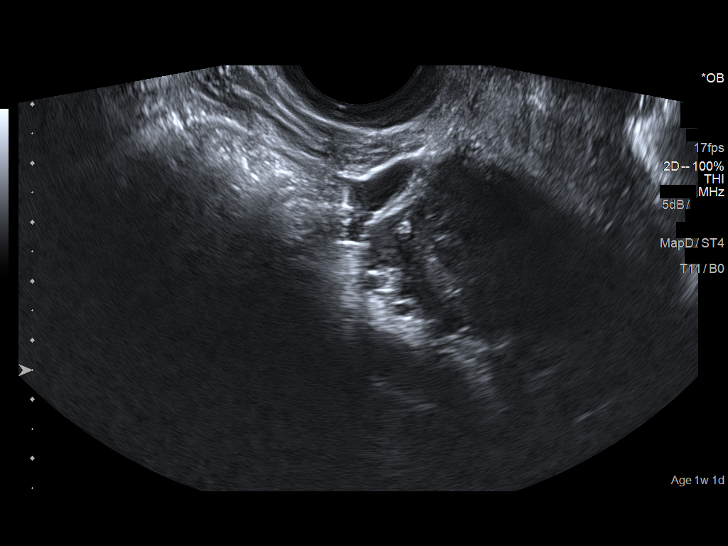
[im 53/55]
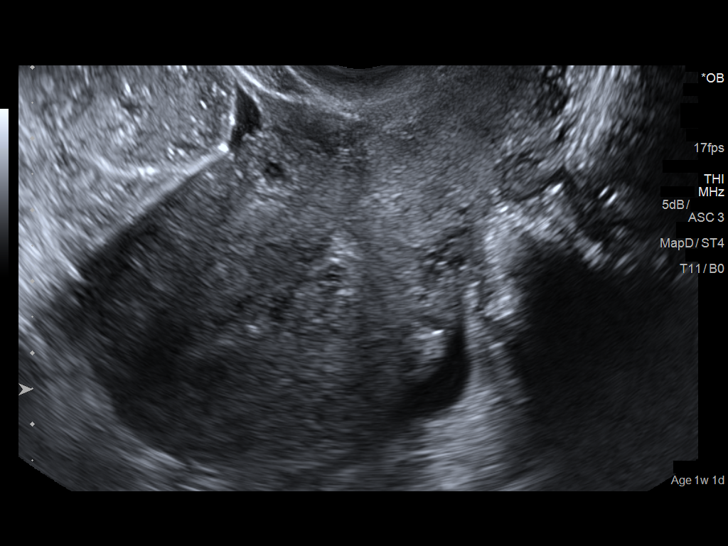

[13 of 28 positions shown; findings below may reference images not displayed]

FINDINGS: Intrauterine gestational sac: Not visualized

Yolk sac:  Not visualized

Embryo:  Not visualized

Cardiac Activity: Not visualized

Maternal uterus/adnexae: Uterus measures 6.1 x 4.1 x 5.3 cm in size.
No intrauterine mass is seen. The endometrium measures 13 mm in
thickness and appears somewhat inhomogeneous. Right ovary measures
2.5 x 2.3 x 2.6 cm. Left ovary could not be visualized by either
transabdominal or transvaginal technique. No left-sided pelvic mass
is seen. There is a small amount of free pelvic fluid.
IMPRESSION: No intrauterine mass or gestation is seen. Endometrium is mildly
inhomogeneous. There is a small amount of free pelvic fluid. Left
ovary not visualize ; no extrauterine pelvic or adnexal masses
appreciable by either transabdominal or transvaginal technique on
this study.

Given these findings and a positive beta HCG value, differential
considerations include intrauterine gestation too early to be seen
by either transabdominal or transvaginal technique; recent
spontaneous abortion; possible ectopic gestation. Close clinical and
laboratory correlation advised. In particular, serial beta HCG
values advised. Timing of repeat ultrasound study will in large part
depend on clinical findings and serial beta HCG values.

## 2019-10-10 DEATH — deceased
